# Patient Record
Sex: Female | Born: 1965
Health system: Southern US, Community
[De-identification: ages and names within clinical notes are randomized; demographics above are authoritative.]

## PROBLEM LIST (undated history)

## (undated) DIAGNOSIS — I10 Essential (primary) hypertension: Secondary | ICD-10-CM

## (undated) DIAGNOSIS — F32A Depression, unspecified: Secondary | ICD-10-CM

## (undated) DIAGNOSIS — E669 Obesity, unspecified: Secondary | ICD-10-CM

## (undated) DIAGNOSIS — F4310 Post-traumatic stress disorder, unspecified: Secondary | ICD-10-CM

## (undated) DIAGNOSIS — F329 Major depressive disorder, single episode, unspecified: Secondary | ICD-10-CM

## (undated) HISTORY — PX: INDUCED ABORTION: SHX677

---

## 1997-12-17 ENCOUNTER — Encounter: Admission: RE | Admit: 1997-12-17 | Discharge: 1997-12-17 | Payer: Self-pay | Admitting: Family Medicine

## 1998-01-13 ENCOUNTER — Encounter: Admission: RE | Admit: 1998-01-13 | Discharge: 1998-01-13 | Payer: Self-pay | Admitting: Family Medicine

## 1998-03-31 ENCOUNTER — Encounter: Admission: RE | Admit: 1998-03-31 | Discharge: 1998-03-31 | Payer: Self-pay | Admitting: Family Medicine

## 2002-04-29 ENCOUNTER — Inpatient Hospital Stay (HOSPITAL_COMMUNITY): Admission: EM | Admit: 2002-04-29 | Discharge: 2002-05-03 | Payer: Self-pay | Admitting: Psychiatry

## 2003-10-31 ENCOUNTER — Inpatient Hospital Stay (HOSPITAL_COMMUNITY): Admission: EM | Admit: 2003-10-31 | Discharge: 2003-11-02 | Payer: Self-pay | Admitting: *Deleted

## 2003-11-02 ENCOUNTER — Inpatient Hospital Stay (HOSPITAL_COMMUNITY): Admission: RE | Admit: 2003-11-02 | Discharge: 2003-11-06 | Payer: Self-pay | Admitting: Psychiatry

## 2004-03-04 ENCOUNTER — Ambulatory Visit: Payer: Self-pay | Admitting: *Deleted

## 2006-04-27 ENCOUNTER — Ambulatory Visit: Payer: Self-pay | Admitting: Family Medicine

## 2006-05-04 ENCOUNTER — Encounter (INDEPENDENT_AMBULATORY_CARE_PROVIDER_SITE_OTHER): Payer: Self-pay | Admitting: Family Medicine

## 2006-05-04 LAB — CONVERTED CEMR LAB
Cholesterol: 156 mg/dL
Hemoglobin: 12.9 g/dL
LDL Cholesterol: 87 mg/dL
RBC count: 4.5 10*6/uL
RBC: 4.5 M/uL
RDW: 14.3 %
Triglycerides: 75 mg/dL

## 2006-05-11 ENCOUNTER — Ambulatory Visit: Payer: Self-pay | Admitting: Family Medicine

## 2006-05-15 ENCOUNTER — Encounter: Payer: Self-pay | Admitting: Family Medicine

## 2006-05-15 DIAGNOSIS — F411 Generalized anxiety disorder: Secondary | ICD-10-CM | POA: Insufficient documentation

## 2006-05-15 DIAGNOSIS — F329 Major depressive disorder, single episode, unspecified: Secondary | ICD-10-CM

## 2006-05-15 DIAGNOSIS — A6000 Herpesviral infection of urogenital system, unspecified: Secondary | ICD-10-CM | POA: Insufficient documentation

## 2006-05-15 DIAGNOSIS — I10 Essential (primary) hypertension: Secondary | ICD-10-CM | POA: Insufficient documentation

## 2006-05-15 DIAGNOSIS — F32A Depression, unspecified: Secondary | ICD-10-CM | POA: Insufficient documentation

## 2006-05-15 DIAGNOSIS — Z8639 Personal history of other endocrine, nutritional and metabolic disease: Secondary | ICD-10-CM

## 2006-05-15 DIAGNOSIS — J309 Allergic rhinitis, unspecified: Secondary | ICD-10-CM | POA: Insufficient documentation

## 2006-05-15 DIAGNOSIS — H905 Unspecified sensorineural hearing loss: Secondary | ICD-10-CM | POA: Insufficient documentation

## 2006-05-15 DIAGNOSIS — Z862 Personal history of diseases of the blood and blood-forming organs and certain disorders involving the immune mechanism: Secondary | ICD-10-CM | POA: Insufficient documentation

## 2006-06-06 ENCOUNTER — Ambulatory Visit: Payer: Self-pay | Admitting: Family Medicine

## 2006-06-08 ENCOUNTER — Ambulatory Visit: Payer: Self-pay | Admitting: Family Medicine

## 2006-07-26 ENCOUNTER — Ambulatory Visit: Payer: Self-pay | Admitting: Family Medicine

## 2006-09-06 ENCOUNTER — Ambulatory Visit: Payer: Self-pay | Admitting: Family Medicine

## 2006-09-25 ENCOUNTER — Telehealth (INDEPENDENT_AMBULATORY_CARE_PROVIDER_SITE_OTHER): Payer: Self-pay | Admitting: Family Medicine

## 2006-10-18 ENCOUNTER — Ambulatory Visit: Payer: Self-pay | Admitting: Family Medicine

## 2006-10-18 DIAGNOSIS — M549 Dorsalgia, unspecified: Secondary | ICD-10-CM | POA: Insufficient documentation

## 2006-10-19 ENCOUNTER — Encounter (INDEPENDENT_AMBULATORY_CARE_PROVIDER_SITE_OTHER): Payer: Self-pay | Admitting: Family Medicine

## 2006-10-19 LAB — CONVERTED CEMR LAB
BUN: 11 mg/dL (ref 6–23)
Chloride: 109 meq/L (ref 96–112)
Potassium: 4.3 meq/L (ref 3.5–5.3)

## 2006-10-23 ENCOUNTER — Telehealth (INDEPENDENT_AMBULATORY_CARE_PROVIDER_SITE_OTHER): Payer: Self-pay | Admitting: Family Medicine

## 2006-10-25 ENCOUNTER — Telehealth (INDEPENDENT_AMBULATORY_CARE_PROVIDER_SITE_OTHER): Payer: Self-pay | Admitting: *Deleted

## 2006-10-26 ENCOUNTER — Ambulatory Visit: Payer: Self-pay | Admitting: Family Medicine

## 2006-11-15 ENCOUNTER — Encounter (INDEPENDENT_AMBULATORY_CARE_PROVIDER_SITE_OTHER): Payer: Self-pay | Admitting: Family Medicine

## 2006-11-15 ENCOUNTER — Encounter: Admission: RE | Admit: 2006-11-15 | Discharge: 2006-11-15 | Payer: Self-pay | Admitting: Family Medicine

## 2006-11-16 ENCOUNTER — Ambulatory Visit: Payer: Self-pay | Admitting: Family Medicine

## 2006-11-16 ENCOUNTER — Telehealth (INDEPENDENT_AMBULATORY_CARE_PROVIDER_SITE_OTHER): Payer: Self-pay | Admitting: *Deleted

## 2006-11-16 DIAGNOSIS — R0989 Other specified symptoms and signs involving the circulatory and respiratory systems: Secondary | ICD-10-CM | POA: Insufficient documentation

## 2006-11-16 DIAGNOSIS — R21 Rash and other nonspecific skin eruption: Secondary | ICD-10-CM | POA: Insufficient documentation

## 2006-11-16 DIAGNOSIS — N946 Dysmenorrhea, unspecified: Secondary | ICD-10-CM | POA: Insufficient documentation

## 2006-11-16 DIAGNOSIS — R0609 Other forms of dyspnea: Secondary | ICD-10-CM | POA: Insufficient documentation

## 2006-11-22 ENCOUNTER — Telehealth (INDEPENDENT_AMBULATORY_CARE_PROVIDER_SITE_OTHER): Payer: Self-pay | Admitting: Family Medicine

## 2006-11-27 ENCOUNTER — Emergency Department (HOSPITAL_COMMUNITY): Admission: EM | Admit: 2006-11-27 | Discharge: 2006-11-27 | Payer: Self-pay | Admitting: Emergency Medicine

## 2006-11-27 ENCOUNTER — Telehealth (INDEPENDENT_AMBULATORY_CARE_PROVIDER_SITE_OTHER): Payer: Self-pay | Admitting: Family Medicine

## 2006-12-12 ENCOUNTER — Telehealth (INDEPENDENT_AMBULATORY_CARE_PROVIDER_SITE_OTHER): Payer: Self-pay | Admitting: Family Medicine

## 2007-03-25 ENCOUNTER — Encounter (INDEPENDENT_AMBULATORY_CARE_PROVIDER_SITE_OTHER): Payer: Self-pay | Admitting: Family Medicine

## 2008-05-22 IMAGING — CR DG CHEST 2V
2 series · 2 of 2 positions shown · non-contrast
Comparison: 10/31/03.

CLINICAL DATA: 41 year-old-female with shortness of breath, bronchitis. 
CHEST - 2 VIEW:

[view not recorded (1 of 2)]
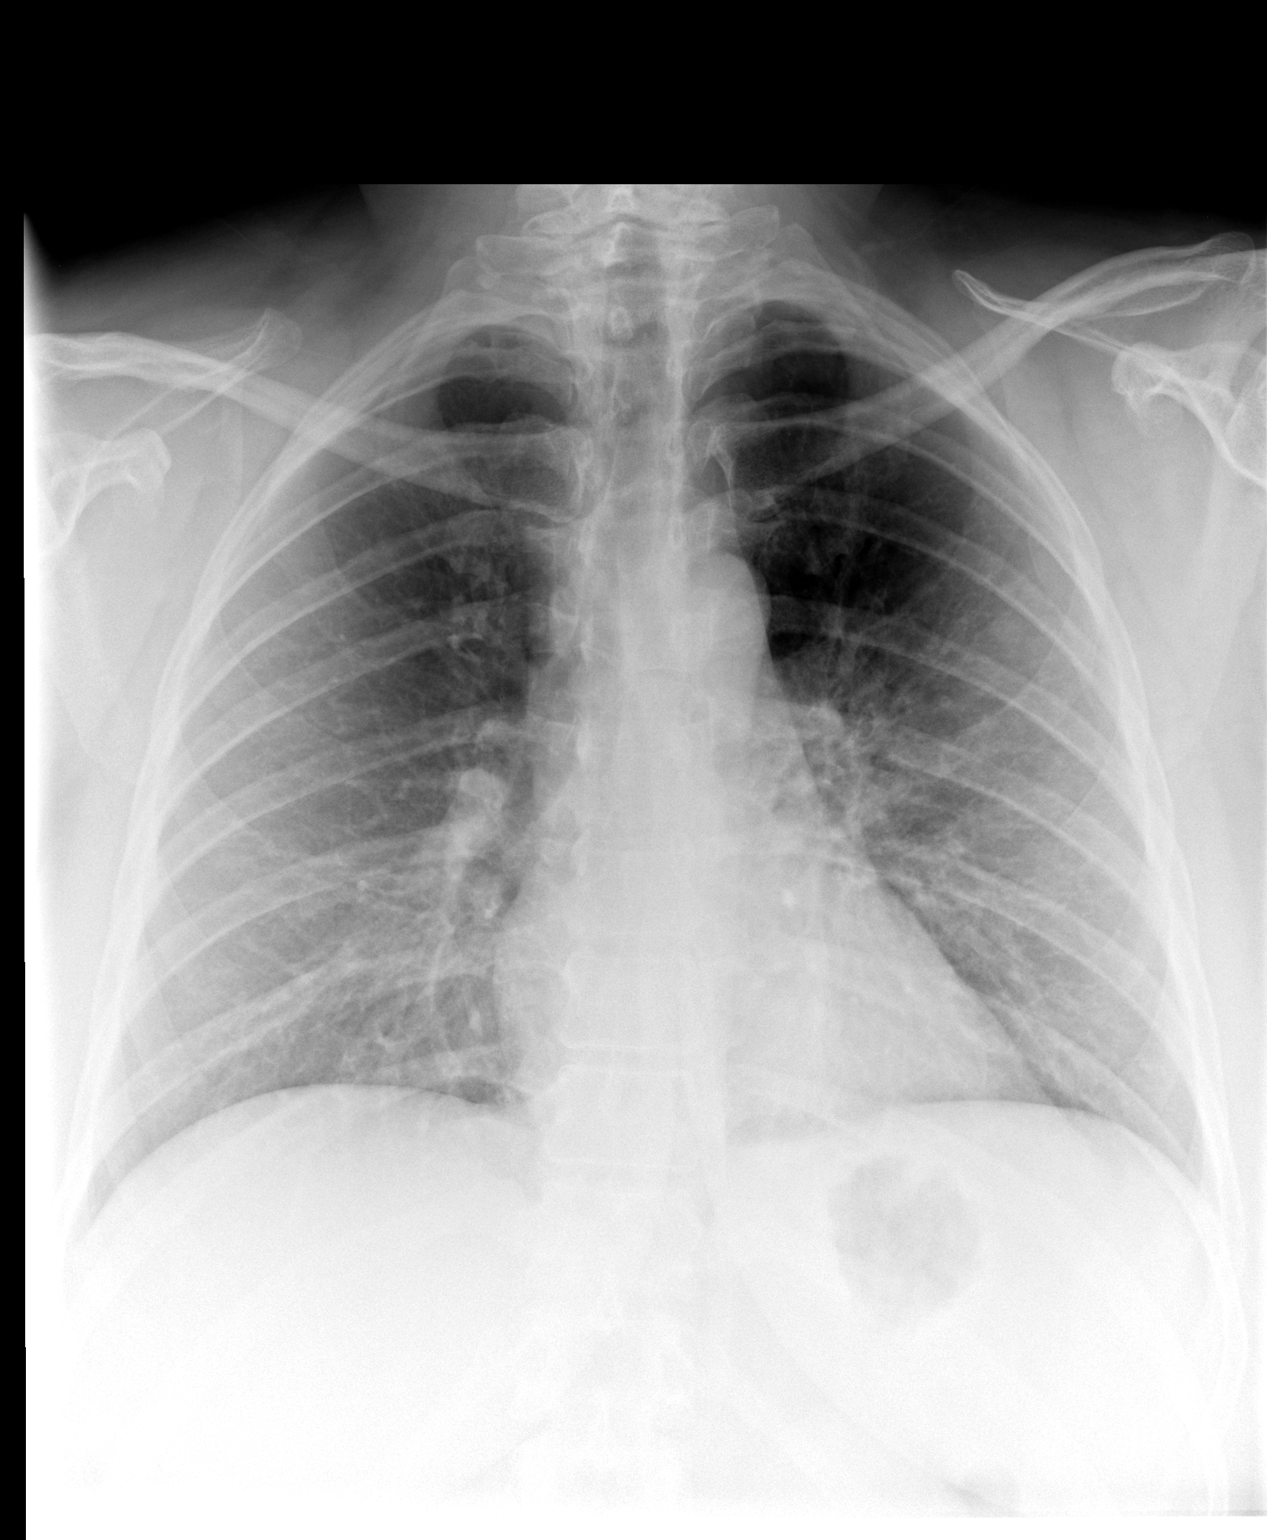

[view not recorded (2 of 2)]
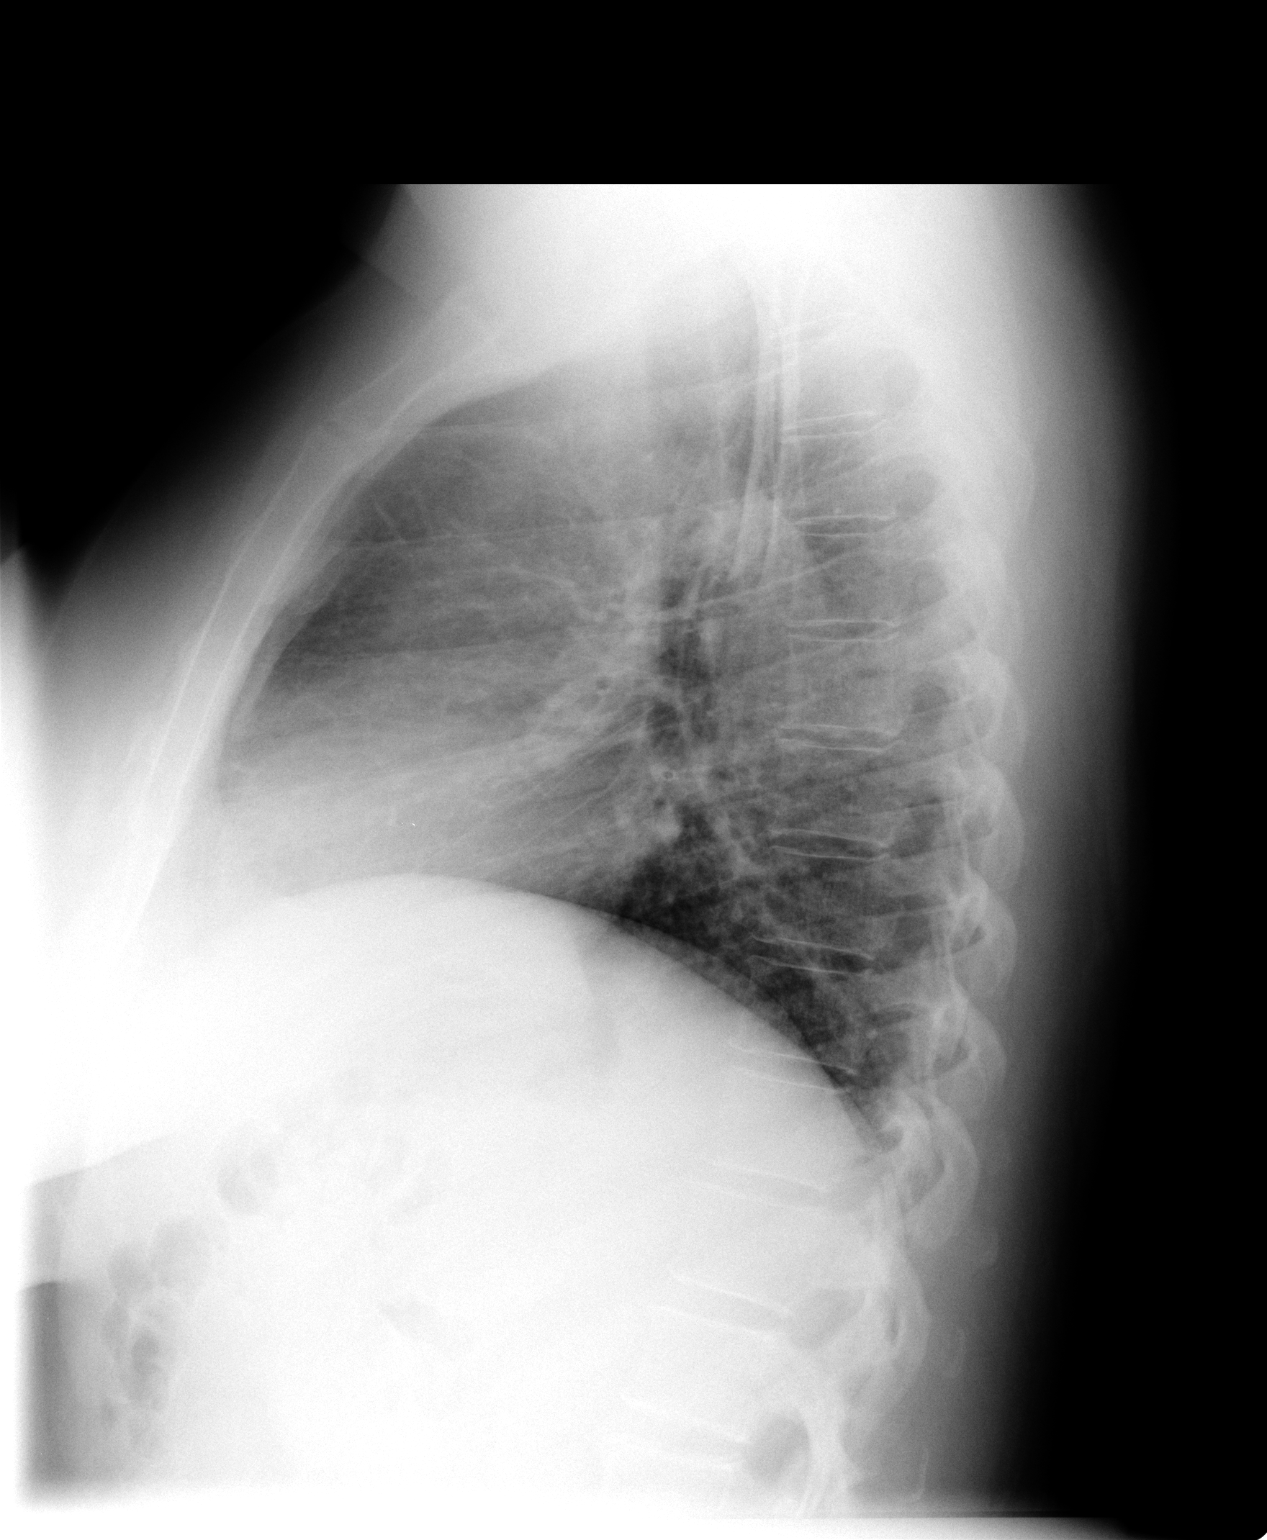

[2 of 2 positions shown; findings below may reference images not displayed]

FINDINGS: Mild central bronchial thickening without  acute pneumonia, edema, effusion or pneumothorax.   In the left upper chest, there is faint 10 mm nodular density.  This is a new finding compared to 10/31/03.  Underlying nodule is not excluded.
IMPRESSION: 1.  Chronic bronchial thickening suspicious for bronchitis. 
2.   Left upper lobe 10 mm nodule by plain radiography.    Recommend follow-up chest CT.

## 2008-12-17 ENCOUNTER — Ambulatory Visit (HOSPITAL_COMMUNITY): Admission: RE | Admit: 2008-12-17 | Discharge: 2008-12-17 | Payer: Self-pay | Admitting: Obstetrics & Gynecology

## 2008-12-17 ENCOUNTER — Other Ambulatory Visit: Admission: RE | Admit: 2008-12-17 | Discharge: 2008-12-17 | Payer: Self-pay | Admitting: Obstetrics and Gynecology

## 2010-07-17 ENCOUNTER — Encounter: Payer: Self-pay | Admitting: Family Medicine

## 2010-11-11 NOTE — Discharge Summary (Signed)
[name], [name]                        ACCOUNT NO.:  [id number]   MEDICAL RECORD NO.:  [id number]                   PATIENT TYPE:  IPS   LOCATION:  0303                                 [facility]ILITY[facility]  [facility]   PHYSICIAN:  Jeanice Lim, M.D.              DATE OF BIRTH:  01/28/1966   DATE OF ADMISSION:  04/29/2002  DATE OF DISCHARGE:  05/03/2002                                 DISCHARGE SUMMARY   IDENTIFYING DATA:  This is a 45 year old female, single, _____________  history of anxiety and borderline personality disorder referred by her  psychiatrist for hopelessness and thoughts of suicide.   MEDICATIONS:  1. Effexor  2. Folic acid  3. Zyprexa.  4. Topamax   ALLERGIES:  No known drug allergies.   PHYSICAL EXAMINATION:  Absolutely normal and it is neurologically nonfocal.   ADMISSION LABORATORIES:  Within normal limits.   MENTAL STATUS EXAM:  Obese, white female in no acute distress.  Fully alert,  cooperative, pleasant, calm.  Speech within normal limits.  Mood: Depressed  affect.  _____________________.  Thought process goal-directed, ___________  positive for suicidal ideation with planned overdose, contracting for  safety.  Cognitively intact.  Judgment and insight poor.   ADMISSION DIAGNOSES:   AXIS I:  Patient with personality disorder.   AXIS II:  Borderline personality disorder by history.   AXIS III:  None.   AXIS IV:  Moderate problems with work and financial stressors.   AXIS V:  36 plus 66.   HOSPITAL COURSE:  Patient was admitted with routine _______ medications,  underwent further monitoring and sponsored for safety.  Reported suicidal  ideation.  Planning different ways to kill herself and described multiple  depressive symptoms including cognitive dulling from Topamax.  Patient was  resumed on medications:  Effexor, folic acid, Zyprex, Topamax and Klonopin.  Topamax was decreased and Zyprexa optimized as well as Strattera added for  history of  ADD and to help with concentration and attention.  Patient  reported positive response to medication changes, participated in therapy  and after care planning and initially at discharge was improved.  Patient's  mood was more euthymic, affect brighter, thought process goal-directed,  thought content ___________ for dangerous ideation or psychotic symptoms.  Patient reported motivation to be compliant with the after care plan.   DISCHARGE MEDICATIONS:  Patient was discharged on medications:  1. Folic acid 1 mg q.d.  2. Klonopin 1 mg q.h.s.  3. Zyprexa 2.5 mg q.h.s.  4. Effexor XR 75 mg b.i.d.  5. Topamax 100 mg q.h.s.  6. Strattera 40 mg q.a.m.  7. Ambien 10 mg q.h.s. p.r.n.   Patient will follow up with Dr. Jennelle Human on November 17 at 3:00 PM and with me  November 10 at 10:00 AM.   DISCHARGE DIAGNOSES:   AXIS I:  Patient with personality disorder.   AXIS II:  Borderline personality disorder by history.   AXIS  III:  None.   AXIS IV:  Moderate problems with work and financial stressors.   AXIS VKallie Locks, M.D.    JEM/MEDQ  D:  05/19/2002  T:  05/19/2002  Job:  [id number]
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
## 2010-11-11 NOTE — Discharge Summary (Signed)
[name], [name]                      ACCOUNT NO.:  [id number]   MEDICAL RECORD NO.:  [id number]                   PATIENT TYPE:  IPS   LOCATION:  0506                                 [facility]ILITY[facility]  [facility]   PHYSICIAN:  Jeanice Lim, M.D.              DATE OF BIRTH:  [birth year]   DATE OF ADMISSION:  11/02/2003  DATE OF DISCHARGE:  11/06/2003                                 DISCHARGE SUMMARY   IDENTIFYING DATA:  This is a 45 year old single Caucasian female voluntarily  admitted with a history of borderline personality disorder and depression.  She had taken 100 tablets of various medications, pindolol, Geodon, Remeron,  after having an argument with mother.  She felt hopeless, took pills.  She  endorsed increased depression since December, loss of hope for future,  feeling worthless and increased irritability.  She was followed up by Meredith Staggers, M.D.  This was the second admission to [facility]; last in November 2003.  Felix Pacini was the therapist.  She attempted to cut wrist on January 5 and  saw Fred May for EMDR therapy.  She had used alcohol on the weekend, three  to four drinks on weekend days with reportedly none for 10 months, as per  the patient.  Primary care physician: The patient uses [facility].  Medical problems: Obesity.  She quit smoking and alcohol 10 months ago.   MEDICATIONS:  1. Pindolol 10 mg t.i.d.  2. Geodon 40 mg b.i.d.  3. Effexor XR 75 mg b.i.d.  4. Klonopin 0.5 mg q.h.s.  5. Zyprexa 5 mg q.h.s.  6. Topamax 100 mg q.h.s.  7. Folic acid.  8. B12.   The patient had been not taking Geodon, BuSpar, and pindolol, as well as  Seroquel due to lack of response.   DRUG ALLERGIES:  No known drug allergies.   PHYSICAL EXAMINATION:  GENERAL:  Within normal limits.  NEUROLOGIC:  Nonfocal.   LABORATORY DATA:  Routine admission labs:  Within normal limits.   MENTAL STATUS EXAM:  Fully alert, pleasant, polite, bright affect,  cooperative.  Speech:  Within normal limits, articulate.  Mood: Depressed,  hopeless.  Thought process: Goal directed; positive suicidal ideation,  ambivalent about outcome of attempt.  Cognitive: Intact.  Judgment and  insight: Fair to partial.  Impulse control: Questionable by history.   ADMISSION DIAGNOSES:   AXIS I:  Major depressive disorder, recurrent, severe.   AXIS II:  Borderline personality disorder, by history.   AXIS III:  1. Status post polypharmacy overdose.  2. Hearing impairment.  3. Obesity.   AXIS IV:  Moderate family conflict, problems with primary support group, and  economic problems.   AXIS V:  [id number]   HOSPITAL COURSE:  The patient was admitted, ordered routine p.r.n.  medications, underwent further monitoring, and was encouraged to participate  in individual, group, and milieu therapy.  Recommendation for the patient  for DBT, in which the  patient had not participated in the past but would be  appropriate for her condition.  The patient was monitored for safety.  The  patient participated in some healthy choices for lifestyle support regarding  weight control.  Routine admission labs and medical monitoring to rule out a  reversible organic etiology of psychopathology.  Initially, plan was to  increase Effexor to optimize control of depression.  The patient received  supportive therapy and medications were resumed as well as p.r.n.s ordered  at the time of admission.  Family session was ordered with mother due to  this being part of the trigger for overdose.  The patient was optimized on  Klonopin and this was then decreased since the patient felt that she was  doing quite well on her previous medications and did not want to make  changes.  The patient participated in further evaluation monitoring and  showed no inappropriate or dangerous behavior.  Dangerous ideation resolved.  The patient reported regret regarding overdose and showed increased insight  and judgment as well  improvement in healthy coping skills.   CONDITION ON DISCHARGE:  The patient was discharged in improved condition.  Mood was more euthymic.  Affect: Brighter.  Thought processes: Goal  directed.  Family meeting was productive in resolving family conflict, which  was a trigger.  The patient felt much more hopeful and in control,  regretting overdose again.  The patient was given medication education.   DISCHARGE MEDICATIONS:  1. Zyprexa 5 mg q.h.s.  2. Effexor XR 75 mg b.i.d.  3. Topamax 100 mg q.h.s.  4. Klonopin 0.5 mg q.h.s.   FOLLOW UP:  The patient requested papers be filled for FMLA out May 9 to  return May 15.  The patient was to follow up with Meredith Staggers, M.D., and  Lenore Cordia.   DISCHARGE DIAGNOSES:   AXIS I:  Major depressive disorder, recurrent, severe.   AXIS II:  Borderline personality disorder, by history.   AXIS III:  1. Status post polypharmacy overdose.  2. Hearing impairment.  3. Obesity.   AXIS IV:  Moderate family conflict, problems with primary support group, and  economic problems.   AXIS V:  Global assessment of functioning on discharge was 55.                                               Jeanice Lim, M.D.    JEM/MEDQ  D:  12/06/2003  T:  12/06/2003  Job:  [id number]

## 2011-04-13 LAB — DIFFERENTIAL
Basophils Absolute: 0
Basophils Relative: 1
Lymphs Abs: 1.8
Monocytes Absolute: 0.5
Monocytes Relative: 9
Neutro Abs: 3.5
Neutrophils Relative %: 59

## 2011-04-13 LAB — BASIC METABOLIC PANEL
BUN: 7
Calcium: 9
Chloride: 110
Creatinine, Ser: 0.69
GFR calc non Af Amer: >60
Glucose, Bld: 106 — ABNORMAL HIGH
Potassium: 3.8
Sodium: 140

## 2011-04-13 LAB — BLOOD GAS, ARTERIAL
Bicarbonate: 22.8
Patient temperature: 37
pH, Arterial: 7.527 — ABNORMAL HIGH
pO2, Arterial: 89.4

## 2011-07-05 ENCOUNTER — Emergency Department (HOSPITAL_COMMUNITY)
Admission: EM | Admit: 2011-07-05 | Discharge: 2011-07-05 | Disposition: A | Discharge status: Home / Self Care | Payer: BC Managed Care – PPO | Attending: Emergency Medicine | Admitting: Emergency Medicine

## 2011-07-05 ENCOUNTER — Emergency Department (HOSPITAL_COMMUNITY): Payer: BC Managed Care – PPO

## 2011-07-05 ENCOUNTER — Encounter (HOSPITAL_COMMUNITY): Payer: Self-pay | Admitting: Emergency Medicine

## 2011-07-05 DIAGNOSIS — R062 Wheezing: Secondary | ICD-10-CM | POA: Insufficient documentation

## 2011-07-05 DIAGNOSIS — R509 Fever, unspecified: Secondary | ICD-10-CM | POA: Insufficient documentation

## 2011-07-05 DIAGNOSIS — R0602 Shortness of breath: Secondary | ICD-10-CM | POA: Insufficient documentation

## 2011-07-05 DIAGNOSIS — R059 Cough, unspecified: Secondary | ICD-10-CM | POA: Insufficient documentation

## 2011-07-05 DIAGNOSIS — E119 Type 2 diabetes mellitus without complications: Secondary | ICD-10-CM | POA: Insufficient documentation

## 2011-07-05 DIAGNOSIS — I10 Essential (primary) hypertension: Secondary | ICD-10-CM | POA: Insufficient documentation

## 2011-07-05 DIAGNOSIS — J9801 Acute bronchospasm: Secondary | ICD-10-CM

## 2011-07-05 DIAGNOSIS — R05 Cough: Secondary | ICD-10-CM | POA: Insufficient documentation

## 2011-07-05 DIAGNOSIS — F329 Major depressive disorder, single episode, unspecified: Secondary | ICD-10-CM | POA: Insufficient documentation

## 2011-07-05 DIAGNOSIS — J189 Pneumonia, unspecified organism: Secondary | ICD-10-CM

## 2011-07-05 DIAGNOSIS — Z79899 Other long term (current) drug therapy: Secondary | ICD-10-CM | POA: Insufficient documentation

## 2011-07-05 DIAGNOSIS — F3289 Other specified depressive episodes: Secondary | ICD-10-CM | POA: Insufficient documentation

## 2011-07-05 HISTORY — DX: Depression, unspecified: F32.A

## 2011-07-05 HISTORY — DX: Obesity, unspecified: E66.9

## 2011-07-05 HISTORY — DX: Major depressive disorder, single episode, unspecified: F32.9

## 2011-07-05 HISTORY — DX: Essential (primary) hypertension: I10

## 2011-07-05 MED ORDER — IPRATROPIUM BROMIDE 0.02 % IN SOLN
0.5000 mg | Freq: Once | RESPIRATORY_TRACT | Status: AC
  Administered 2011-07-05: 0.5 mg via RESPIRATORY_TRACT
  Filled 2011-07-05: qty 2.5

## 2011-07-05 MED ORDER — ALBUTEROL SULFATE (5 MG/ML) 0.5% IN NEBU
5.0000 mg | INHALATION_SOLUTION | Freq: Once | RESPIRATORY_TRACT | Status: AC
  Administered 2011-07-05: 5 mg via RESPIRATORY_TRACT
  Filled 2011-07-05: qty 1

## 2011-07-05 MED ORDER — MOXIFLOXACIN HCL 400 MG PO TABS: 400.0000 mg | ORAL_TABLET | Freq: Every day | ORAL | Status: DC

## 2011-07-05 MED ORDER — ALBUTEROL SULFATE HFA 108 (90 BASE) MCG/ACT IN AERS
2.0000 | INHALATION_SPRAY | RESPIRATORY_TRACT | Status: DC
  Administered 2011-07-05: 2 via RESPIRATORY_TRACT
  Filled 2011-07-05: qty 6.7

## 2011-07-05 MED ORDER — MOXIFLOXACIN HCL 400 MG PO TABS
400.0000 mg | ORAL_TABLET | Freq: Once | ORAL | Status: AC
  Administered 2011-07-05: 400 mg via ORAL
  Filled 2011-07-05: qty 1

## 2011-07-05 MED ORDER — ONDANSETRON HCL 8 MG PO TABS
8.0000 mg | ORAL_TABLET | Freq: Once | ORAL | Status: AC
  Administered 2011-07-05: 8 mg via ORAL
  Filled 2011-07-05: qty 1

## 2011-07-05 MED ORDER — ONDANSETRON HCL 4 MG PO TABS: 8.0000 mg | ORAL_TABLET | Freq: Four times a day (QID) | ORAL | Status: AC

## 2011-07-05 NOTE — ED Notes (Signed)
PT. REPORTS PRODUCTIVE COUGH WITH SOB AND CHEST CONGESTION FOR 5 DAYS.

## 2011-07-05 NOTE — ED Provider Notes (Signed)
History     CSN: [id number]  Arrival date & time 07/05/11  0250   First MD Initiated Contact with Patient 07/05/11 0345      Chief Complaint  Patient presents with  . Cough    Patient is a 46 y.o. female presenting with cough. The history is provided by the patient.  Cough   patient reports 4-5 days of coughing congestion.  She reports some shortness of breath that developed today.  She's had subjective fever and chills at home.  She denies unilateral leg swelling.  She has no prior history of PE or DVT.  She reports no nausea vomiting or diarrhea.  She denies abdominal pain.  She reports no history of asthma or COPD.  She reports that she previously had stopped smoking for 6 years however one year ago she restarted smoking cigarettes.  Nothing worsens her symptoms.  Nothing improves her symptoms.  Her symptoms are constant.      Past Medical History  Diagnosis Date  . Diabetes mellitus   . Hypertension   . Depression   . Obesity     History reviewed. No pertinent past surgical history.  History reviewed. No pertinent family history.  History  Substance Use Topics  . Smoking status: Current Everyday Smoker  . Smokeless tobacco: Not on file  . Alcohol Use: No    OB History    Grav Para Term Preterm Abortions TAB SAB Ect Mult Living                  Review of Systems  Respiratory: Positive for cough.   All other systems reviewed and are negative.    Allergies  Review of patient's allergies indicates no known allergies.  Home Medications   Current Outpatient Rx  Name Route Sig Dispense Refill  . CETIRIZINE HCL 10 MG PO TABS Oral Take 10 mg by mouth daily.    . CORICIDIN HBP COUGH/COLD PO Oral Take 1 tablet by mouth 2 (two) times daily as needed. For cough    . CLONAZEPAM 0.5 MG PO TABS Oral Take 0.5 mg by mouth 2 (two) times daily.    [name] METFORMIN HCL 500 MG PO TABS Oral Take 500 mg by mouth 2 (two) times daily with a meal.    . PERPHENAZINE 4 MG PO TABS Oral  Take 10 mg by mouth at bedtime.    [name] VALSARTAN-HYDROCHLOROTHIAZIDE 320-12.5 MG PO TABS Oral Take 1 tablet by mouth daily.    . VENLAFAXINE HCL 75 MG PO TABS Oral Take 225 mg by mouth every morning.    [name] MOXIFLOXACIN HCL 400 MG PO TABS Oral Take 1 tablet (400 mg total) by mouth daily. 7 tablet 0  . ONDANSETRON HCL 4 MG PO TABS Oral Take 2 tablets (8 mg total) by mouth every 6 (six) hours. 12 tablet 0    BP 126/79  Pulse 113  Temp(Src) 98.9 F (37.2 C) (Oral)  Resp 22  SpO2 97%  LMP 06/28/2011  Physical Exam  Nursing note and vitals reviewed. Constitutional: She is oriented to person, place, and time. She appears well-developed and well-nourished. No distress.  HENT:  Head: Normocephalic and atraumatic.  Eyes: EOM are normal.  Neck: Normal range of motion.  Cardiovascular: Normal rate, regular rhythm and normal heart sounds.   Pulmonary/Chest: Effort normal. She has wheezes. She has no rales.       Rhonchi diffusely  Abdominal: Soft. She exhibits no distension. There is no tenderness.  Musculoskeletal: Normal range  of motion.  Neurological: She is alert and oriented to person, place, and time.  Skin: Skin is warm and dry.  Psychiatric: She has a normal mood and affect. Judgment normal.    ED Course  Procedures (including critical care time)  Labs Reviewed - No data to display Dg Chest 2 View  07/05/2011  *RADIOLOGY REPORT*  Clinical Data: Short of breath.  Productive cough.  CHEST - 2 VIEW  Comparison: 12/24/2006.  Findings: Faint left midlung patchy airspace opacity is present, suspicious for a small focus of pneumonia.  There is no effusion. Cardiopericardial silhouette appears within normal limits.  Right lung appears normal.  Trachea midline.  Radiographic follow-up is recommended to ensure clearing and exclude an underlying lesion.  Radiographic clearing is usually observed at 4-6 weeks.  IMPRESSION: Faint patchy left midlung airspace opacities suspicious for a small focus of  pneumonia.  Original Report Authenticated By: Andreas Newport, M.D.   I personally reviewed the x-ray  1. CAP (community acquired pneumonia)   2. Bronchospasm       MDM  The patient feels much better after albuterol and Atrovent in the emergency department.  She does appear to have community-acquired pneumonia and she's been given a dose of Avelox in the emergency department and will be discharged home with a seven-day course of Avelox.  I've instructed the patient to stop smoking.        Lyanne Co, MD 07/05/11 [phone]

## 2011-07-05 NOTE — ED Notes (Signed)
Patient complains of cough, and feeling sick since Saturday night, sts forceful cough, hx of bronchitis and feels the same. Chest and head congestion, sts even though her oxygen is ok she still feels trouble with breathing.

## 2011-08-02 NOTE — ED Notes (Signed)
Pt called stated she was not given prescribed Rx Avelox from visit 07/05/11 but was given something else Levofloxacin.   Record checked and call was rcvd from [facility] 07/05/11 (see Avelox Rx for 07/05/11) stating Rx was too expensive.  The chart was reviewed that day by the MD that saw the pt and New Rx for Levofloxacin 750 mg tab 1 po daily x 5 days was rcvd and called to [facility].  Pt now stating she needs the prescribed (Avelox) because the PNA is back and [facility] messed up.  Chart reviewed by Dr Effie Shy today and stated pt needs to be reevaluated.  Pt informed of MD's decision.

## 2012-04-18 ENCOUNTER — Other Ambulatory Visit: Payer: Self-pay | Admitting: Obstetrics & Gynecology

## 2012-04-18 DIAGNOSIS — Z1231 Encounter for screening mammogram for malignant neoplasm of breast: Secondary | ICD-10-CM

## 2012-05-16 ENCOUNTER — Ambulatory Visit: Payer: BC Managed Care – PPO

## 2012-12-24 ENCOUNTER — Emergency Department (INDEPENDENT_AMBULATORY_CARE_PROVIDER_SITE_OTHER)
Admission: EM | Admit: 2012-12-24 | Discharge: 2012-12-24 | Disposition: A | Discharge status: Home / Self Care | Payer: No Typology Code available for payment source | Source: Home / Self Care | Attending: Emergency Medicine | Admitting: Emergency Medicine

## 2012-12-24 ENCOUNTER — Encounter (HOSPITAL_COMMUNITY): Payer: Self-pay | Admitting: Emergency Medicine

## 2012-12-24 DIAGNOSIS — H6012 Cellulitis of left external ear: Secondary | ICD-10-CM

## 2012-12-24 DIAGNOSIS — H60399 Other infective otitis externa, unspecified ear: Secondary | ICD-10-CM

## 2012-12-24 NOTE — ED Notes (Signed)
Pt is needing a work note... Has not been to work in 7 days... Reports she was having anxiety/depression and she self medicated at home by resting... Also c/o left hear pain on x4 yrs; hearing aid on left ear visible... She is alert w/no signs of acute distress.

## 2012-12-24 NOTE — ED Provider Notes (Signed)
History    CSN: [id number] Arrival date & time 12/24/12  1037  First MD Initiated Contact with Patient 12/24/12 1128     Chief Complaint  Patient presents with  . Otalgia   (Consider location/radiation/quality/duration/timing/severity/associated sxs/prior Treatment) HPI Comments: Patient presents urgent care complaining of left ear pain that has been a recurrent percent many years. She tends to apply peroxide and recently was seen by a medical provider at her work site and was prescribed some type of ear drops. She has not fill out this prescription as of yet. Patient is very of said as she is reporting today that last week she was severely depressed for several days and for about 3-4 days she couldn't go to work. She stayed at home in bed at all times barely with any energy to the or talk." I was severely depressed" she reports her psychiatrist was out of town on vacation and she couldn't speak to anyone. Upon to return to work yesterday she was requested to bring a doctor's note reporting that it was okay for her to go back to work.  Although patient is still feeling somewhat anxious she describes she's feeling better wants to go back to work and does not want to lose her job.  Patient is a 47 y.o. female presenting with ear pain. The history is provided by the patient.  Otalgia Location:  Left Behind ear:  Swelling and redness Quality:  Dull Severity:  Mild Onset quality:  Gradual Timing:  Intermittent Chronicity:  Recurrent Context: not direct blow, not elevation change, not foreign body in ear, not loud noise and no water in ear   Relieved by:  Nothing Associated symptoms: rash   Associated symptoms: no abdominal pain, no congestion, no cough, no diarrhea, no ear discharge, no fever, no neck pain, no rhinorrhea, no sore throat, no tinnitus and no vomiting   Risk factors: no recent travel, no chronic ear infection and no prior ear surgery    Past Medical History  Diagnosis Date    . Diabetes mellitus   . Hypertension   . Depression   . Obesity    History reviewed. No pertinent past surgical history. History reviewed. No pertinent family history. History  Substance Use Topics  . Smoking status: Current Every Day Smoker  . Smokeless tobacco: Not on file  . Alcohol Use: No   OB History   Grav Para Term Preterm Abortions TAB SAB Ect Mult Living                 Review of Systems  Constitutional: Negative for fever and activity change.  HENT: Positive for ear pain. Negative for congestion, sore throat, facial swelling, rhinorrhea, neck pain, tinnitus and ear discharge.   Respiratory: Negative for cough.   Gastrointestinal: Negative for vomiting, abdominal pain and diarrhea.  Genitourinary: Negative for dysuria, enuresis, difficulty urinating and dyspareunia.  Skin: Positive for rash. Negative for color change and wound.    Allergies  Review of patient's allergies indicates no known allergies.  Home Medications   Current Outpatient Rx  Name  Route  Sig  Dispense  Refill  . clonazePAM (KLONOPIN) 0.5 MG tablet   Oral   Take 0.5 mg by mouth 2 (two) times daily.         . metFORMIN (GLUCOPHAGE) 500 MG tablet   Oral   Take 500 mg by mouth 2 (two) times daily with a meal.         . perphenazine (TRILAFON) 4  MG tablet   Oral   Take 10 mg by mouth at bedtime.         . valsartan-hydrochlorothiazide (DIOVAN-HCT) 320-12.5 MG per tablet   Oral   Take 1 tablet by mouth daily.         [name] venlafaxine (EFFEXOR) 75 MG tablet   Oral   Take 225 mg by mouth every morning.         . cetirizine (ZYRTEC) 10 MG tablet   Oral   Take 10 mg by mouth daily.         . Chlorpheniramine-DM (CORICIDIN HBP COUGH/COLD PO)   Oral   Take 1 tablet by mouth 2 (two) times daily as needed. For cough          BP 122/76  Pulse 84  Temp(Src) 99.1 F (37.3 C) (Oral)  Resp 18  SpO2 97% Physical Exam  Vitals reviewed. Constitutional: Vital signs are normal.  She appears well-developed and well-nourished.  Non-toxic appearance. She does not have a sickly appearance. She does not appear ill. No distress.  HENT:  Head: Normocephalic.  Right Ear: Tympanic membrane normal.  Left Ear: Tympanic membrane normal. No lacerations. There is drainage and tenderness. No foreign bodies. No mastoid tenderness. Tympanic membrane is not injected, not scarred, not perforated and not bulging. Tympanic membrane mobility is normal. No hemotympanum.  Ears:  Eyes: Conjunctivae are normal. Left eye exhibits no discharge.  Neck: Neck supple. No JVD present.  Abdominal: Soft.  Lymphadenopathy:    She has no cervical adenopathy.  Neurological: She is alert.  Skin: Rash noted. There is erythema. No pallor.    ED Course  Procedures (including critical care time) Labs Reviewed - No data to display No results found. 1. Cellulitis of external ear, left     MDM  Problem #1 chronic external ear infection//severity dermatitis- Instructed patient to stop the use of daily applications of peroxide- patient was seen by a provider at her workplace and was prescribed Corticosporin- she has not yet started.  Problem #2 depression/anxiety disorder Patient reports that during the course of last week she did not go to work for a couple days as she was feeling very depressed stayed at home, sleeping in bed. She did not contact her psychiatrist, and just stayed home Patient is requesting a note stating that it's okay for her to go back to work I instructed patient that there was no particular reason why she couldn't go back to work.  Jimmie Molly, MD 12/24/12 1308

## 2013-02-09 ENCOUNTER — Telehealth: Payer: Self-pay

## 2013-02-09 ENCOUNTER — Ambulatory Visit (INDEPENDENT_AMBULATORY_CARE_PROVIDER_SITE_OTHER): Payer: No Typology Code available for payment source | Admitting: Family Medicine

## 2013-02-09 VITALS — BP 108/78 | HR 96 | Temp 98.4°F | Resp 16 | Ht 69.0 in | Wt 233.0 lb

## 2013-02-09 DIAGNOSIS — H60399 Other infective otitis externa, unspecified ear: Secondary | ICD-10-CM

## 2013-02-09 DIAGNOSIS — H60392 Other infective otitis externa, left ear: Secondary | ICD-10-CM

## 2013-02-09 MED ORDER — CIPROFLOXACIN HCL 500 MG PO TABS: 500.0000 mg | ORAL_TABLET | Freq: Two times a day (BID) | ORAL | Status: DC

## 2013-02-09 MED ORDER — PREDNISONE 20 MG PO TABS: ORAL_TABLET | ORAL | Status: DC

## 2013-02-09 NOTE — Progress Notes (Signed)
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  She has been diagnosed with borderline personality and sees Dr. Donell Beers.  She has chronic otitis externa with chronic use of hearing aid.  She is having 1 day of progressive ear redness which burns and itches.  Objective:  Indurated left face and auricle with yellow green exudate in the canal.  Assessment:  Otitis externa with migration to left face  Plan:  cipro and prednisone.

## 2013-02-10 ENCOUNTER — Telehealth: Payer: Self-pay

## 2013-02-10 NOTE — Telephone Encounter (Signed)
Patient was here and seen and now she said that her symptoms are worse she would like a call back today if possible please call patient at [phone]

## 2013-02-11 ENCOUNTER — Other Ambulatory Visit: Payer: Self-pay | Admitting: Family Medicine

## 2013-02-11 ENCOUNTER — Telehealth: Payer: Self-pay

## 2013-02-11 DIAGNOSIS — R220 Localized swelling, mass and lump, head: Secondary | ICD-10-CM

## 2013-02-11 NOTE — Telephone Encounter (Signed)
Do you want me to call pt to tell her to come?

## 2013-02-11 NOTE — Telephone Encounter (Signed)
Spoke with [name], she still has welps on her face, itching, tenderness and it has migrated to her left eye. She feels she is getting worse and wanted to let Dr Milus Glazier know. What should she do now? She states she cannot come in because of financial reasons. Call at work if it is after today [phone] ext [phone].

## 2013-02-11 NOTE — Telephone Encounter (Signed)
PT WOULD LIKE SOMEONE TO CHECK WITH DR KURT TO SEE IF IT MAY BE SHINGLES ON HER FACE. PLEASE CALL [phone]

## 2013-02-11 NOTE — Telephone Encounter (Signed)
Patient is calling to check on her message.

## 2013-02-12 NOTE — Telephone Encounter (Signed)
Lm on pt cell to come into clinic to be evaluated.

## 2013-02-12 NOTE — Telephone Encounter (Signed)
Yes, please have her return

## 2013-05-20 NOTE — Telephone Encounter (Signed)
No notes

## 2013-06-21 ENCOUNTER — Emergency Department (HOSPITAL_COMMUNITY)
Admission: EM | Admit: 2013-06-21 | Discharge: 2013-06-21 | Disposition: A | Discharge status: Home Health | Payer: No Typology Code available for payment source | Attending: Emergency Medicine | Admitting: Emergency Medicine

## 2013-06-21 ENCOUNTER — Encounter (HOSPITAL_COMMUNITY): Payer: Self-pay | Admitting: Emergency Medicine

## 2013-06-21 DIAGNOSIS — R000 Tachycardia, unspecified: Secondary | ICD-10-CM | POA: Insufficient documentation

## 2013-06-21 DIAGNOSIS — F32A Depression, unspecified: Secondary | ICD-10-CM

## 2013-06-21 DIAGNOSIS — F411 Generalized anxiety disorder: Secondary | ICD-10-CM | POA: Insufficient documentation

## 2013-06-21 DIAGNOSIS — Z3202 Encounter for pregnancy test, result negative: Secondary | ICD-10-CM | POA: Insufficient documentation

## 2013-06-21 DIAGNOSIS — I10 Essential (primary) hypertension: Secondary | ICD-10-CM | POA: Insufficient documentation

## 2013-06-21 DIAGNOSIS — F3289 Other specified depressive episodes: Secondary | ICD-10-CM | POA: Insufficient documentation

## 2013-06-21 DIAGNOSIS — R443 Hallucinations, unspecified: Secondary | ICD-10-CM | POA: Insufficient documentation

## 2013-06-21 DIAGNOSIS — E119 Type 2 diabetes mellitus without complications: Secondary | ICD-10-CM | POA: Insufficient documentation

## 2013-06-21 DIAGNOSIS — F419 Anxiety disorder, unspecified: Secondary | ICD-10-CM

## 2013-06-21 DIAGNOSIS — F17200 Nicotine dependence, unspecified, uncomplicated: Secondary | ICD-10-CM | POA: Insufficient documentation

## 2013-06-21 DIAGNOSIS — F329 Major depressive disorder, single episode, unspecified: Secondary | ICD-10-CM | POA: Insufficient documentation

## 2013-06-21 DIAGNOSIS — Z79899 Other long term (current) drug therapy: Secondary | ICD-10-CM | POA: Insufficient documentation

## 2013-06-21 DIAGNOSIS — Z91040 Latex allergy status: Secondary | ICD-10-CM | POA: Insufficient documentation

## 2013-06-21 LAB — CBC
HCT: 40.6 % (ref 36.0–46.0)
Hemoglobin: 13.9 g/dL (ref 12.0–15.0)
MCH: 30.8 pg (ref 26.0–34.0)
MCHC: 34.2 g/dL (ref 30.0–36.0)
MCV: 89.8 fL (ref 78.0–100.0)
Platelets: 334 K/uL (ref 150–400)
RBC: 4.52 MIL/uL (ref 3.87–5.11)
RDW: 12.8 % (ref 11.5–15.5)
WBC: 10.6 K/uL — ABNORMAL HIGH (ref 4.0–10.5)

## 2013-06-21 LAB — COMPREHENSIVE METABOLIC PANEL WITH GFR
ALT: 17 U/L (ref 0–35)
AST: 17 U/L (ref 0–37)
Albumin: 4.2 g/dL (ref 3.5–5.2)
Alkaline Phosphatase: 66 U/L (ref 39–117)
BUN: 15 mg/dL (ref 6–23)
CO2: 22 meq/L (ref 19–32)
Calcium: 9.8 mg/dL (ref 8.4–10.5)
Chloride: 103 meq/L (ref 96–112)
Creatinine, Ser: 0.85 mg/dL (ref 0.50–1.10)
GFR calc Af Amer: >90 mL/min
GFR calc non Af Amer: 79 mL/min — ABNORMAL LOW
Glucose, Bld: 110 mg/dL — ABNORMAL HIGH (ref 70–99)
Potassium: 3.7 meq/L (ref 3.5–5.1)
Sodium: 139 meq/L (ref 135–145)
Total Bilirubin: 0.8 mg/dL (ref 0.3–1.2)
Total Protein: 7.3 g/dL (ref 6.0–8.3)

## 2013-06-21 LAB — RAPID URINE DRUG SCREEN, HOSP PERFORMED
Benzodiazepines: NOT DETECTED
Cocaine: NOT DETECTED
Opiates: NOT DETECTED

## 2013-06-21 LAB — ETHANOL: Alcohol, Ethyl (B): <11 mg/dL (ref 0–11)

## 2013-06-21 MED ORDER — VENLAFAXINE HCL 75 MG PO TABS: 200.0000 mg | ORAL_TABLET | Freq: Every day | ORAL | Status: DC

## 2013-06-21 MED ORDER — ONDANSETRON HCL 4 MG PO TABS: 4.0000 mg | ORAL_TABLET | Freq: Three times a day (TID) | ORAL | Status: DC | PRN

## 2013-06-21 MED ORDER — METFORMIN HCL 500 MG PO TABS
500.0000 mg | ORAL_TABLET | Freq: Two times a day (BID) | ORAL | Status: DC
  Filled 2013-06-21: qty 1

## 2013-06-21 MED ORDER — HYDROXYZINE HCL 10 MG PO TABS
10.0000 mg | ORAL_TABLET | Freq: Three times a day (TID) | ORAL | Status: DC
  Administered 2013-06-21: 10 mg via ORAL
  Filled 2013-06-21: qty 1

## 2013-06-21 MED ORDER — ACETAMINOPHEN 325 MG PO TABS: 650.0000 mg | ORAL_TABLET | ORAL | Status: DC | PRN

## 2013-06-21 MED ORDER — ZOLPIDEM TARTRATE 5 MG PO TABS: 5.0000 mg | ORAL_TABLET | Freq: Every evening | ORAL | Status: DC | PRN

## 2013-06-21 MED ORDER — IBUPROFEN 200 MG PO TABS: 600.0000 mg | ORAL_TABLET | Freq: Three times a day (TID) | ORAL | Status: DC | PRN

## 2013-06-21 MED ORDER — HYDROCHLOROTHIAZIDE 12.5 MG PO CAPS
12.5000 mg | ORAL_CAPSULE | Freq: Two times a day (BID) | ORAL | Status: DC
  Filled 2013-06-21 (×2): qty 1

## 2013-06-21 MED ORDER — LISINOPRIL 10 MG PO TABS
10.0000 mg | ORAL_TABLET | Freq: Every day | ORAL | Status: DC
  Filled 2013-06-21: qty 1
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
## 2013-06-21 NOTE — ED Provider Notes (Signed)
CSN: [id number]     Arrival date & time 06/21/13  1705 History   First MD Initiated Contact with Patient 06/21/13 1839     Chief Complaint  Patient presents with  . Medical Clearance   (Consider location/radiation/quality/duration/timing/severity/associated sxs/prior Treatment) HPI Comments: Patient here with a friend.  She states that she lost her job in October and had previously been on several psychiatric medications for about 17 years prior to that and had been stable in terms of mood.  She states that since loosing her job, she also lost her insurance and has no one to help with her medications.  She has been to [facility] several times and states that they refused to give her some of her medications (including klonopin) and so she went off these medications.  She states that she has been on effexor which has not really been helping with her depression and that she is tearful.  She is also concerned about some auditory hallucinations (she hears chain saws when there are none) and she states she is becoming more fearful at night.  She denies suicidal or homicidal ideation and states that she feels rather anxious.  She states that someone has told her that she seems "manic" and that she may be bipolar as well though she has only been diagnosed with depression in the past.  She states that she has not seen a psychiatrist since she went off her medications.  The history is provided by the patient. No language interpreter was used.    Past Medical History  Diagnosis Date  . Diabetes mellitus   . Hypertension   . Depression   . Obesity    No past surgical history on file. No family history on file. History  Substance Use Topics  . Smoking status: Current Every Day Smoker  . Smokeless tobacco: Not on file  . Alcohol Use: No   OB History   Grav Para Term Preterm Abortions TAB SAB Ect Mult Living                 Review of Systems  All other systems reviewed and are  negative.    Allergies  Latex  Home Medications   Current Outpatient Rx  Name  Route  Sig  Dispense  Refill  . HYDROXYZINE HCL PO   Oral   Take by mouth.         . metFORMIN (GLUCOPHAGE) 500 MG tablet   Oral   Take 500 mg by mouth 2 (two) times daily with a meal.         . venlafaxine (EFFEXOR) 100 MG tablet   Oral   Take 200 mg by mouth daily.          BP 118/81  Pulse 91  Temp(Src) 98.8 F (37.1 C) (Oral)  Resp 20  SpO2 97% Physical Exam  Nursing note and vitals reviewed. Constitutional: She is oriented to person, place, and time. She appears well-developed and well-nourished. No distress.  HENT:  Head: Normocephalic and atraumatic.  Right Ear: External ear normal.  Left Ear: External ear normal.  Nose: Nose normal.  Mouth/Throat: Oropharynx is clear and moist. No oropharyngeal exudate.  Eyes: Conjunctivae are normal. Pupils are equal, round, and reactive to light. No scleral icterus.  Neck: Normal range of motion. Neck supple.  Cardiovascular: Normal rate, regular rhythm and normal heart sounds.  Exam reveals no gallop and no friction rub.   No murmur heard. Tachycardia noted during triage resolved  Pulmonary/Chest: Effort  normal. No respiratory distress. She has no wheezes. She has no rales. She exhibits no tenderness.  Abdominal: Soft. Bowel sounds are normal. She exhibits no distension. There is no tenderness.  Musculoskeletal: Normal range of motion. She exhibits no edema and no tenderness.  Lymphadenopathy:    She has no cervical adenopathy.  Neurological: She is alert and oriented to person, place, and time. She exhibits normal muscle tone. Coordination normal.  Skin: Skin is warm and dry. No rash noted. No erythema. No pallor.  Psychiatric: Her behavior is normal. Her mood appears anxious. Her affect is labile. Her speech is rapid and/or pressured. Cognition and memory are normal. She expresses impulsivity. She expresses no homicidal and no suicidal  ideation.    ED Course  Procedures (including critical care time) Labs Review Labs Reviewed  CBC - Abnormal; Notable for the following:    WBC 10.6 (*)    All other components within normal limits  COMPREHENSIVE METABOLIC PANEL - Abnormal; Notable for the following:    Glucose, Bld 110 (*)    GFR calc non Af Amer 79 (*)    All other components within normal limits  ETHANOL  URINE RAPID DRUG SCREEN (HOSP PERFORMED)  POCT PREGNANCY, URINE   Imaging Review No results found.  EKG Interpretation   None      Results for orders placed during the hospital encounter of 06/21/13  CBC      Result Value Range   WBC 10.6 (*) 4.0 - 10.5 K/uL   RBC 4.52  3.87 - 5.11 MIL/uL   Hemoglobin 13.9  12.0 - 15.0 g/dL   HCT 16.1  09.6 - 04.5 %   MCV 89.8  78.0 - 100.0 fL   MCH 30.8  26.0 - 34.0 pg   MCHC 34.2  30.0 - 36.0 g/dL   RDW 40.9  81.1 - 91.4 %   Platelets 334  150 - 400 K/uL  COMPREHENSIVE METABOLIC PANEL      Result Value Range   Sodium 139  135 - 145 mEq/L   Potassium 3.7  3.5 - 5.1 mEq/L   Chloride 103  96 - 112 mEq/L   CO2 22  19 - 32 mEq/L   Glucose, Bld 110 (*) 70 - 99 mg/dL   BUN 15  6 - 23 mg/dL   Creatinine, Ser 7.82  0.50 - 1.10 mg/dL   Calcium 9.8  8.4 - 95.6 mg/dL   Total Protein 7.3  6.0 - 8.3 g/dL   Albumin 4.2  3.5 - 5.2 g/dL   AST 17  0 - 37 U/L   ALT 17  0 - 35 U/L   Alkaline Phosphatase 66  39 - 117 U/L   Total Bilirubin 0.8  0.3 - 1.2 mg/dL   GFR calc non Af Amer 79 (*) >90 mL/min   GFR calc Af Amer >90  >90 mL/min  ETHANOL      Result Value Range   Alcohol, Ethyl (B) <11  0 - 11 mg/dL  URINE RAPID DRUG SCREEN (HOSP PERFORMED)      Result Value Range   Opiates NONE DETECTED  NONE DETECTED   Cocaine NONE DETECTED  NONE DETECTED   Benzodiazepines NONE DETECTED  NONE DETECTED   Amphetamines NONE DETECTED  NONE DETECTED   Tetrahydrocannabinol NONE DETECTED  NONE DETECTED   Barbiturates NONE DETECTED  NONE DETECTED  POCT PREGNANCY, URINE      Result  Value Range   Preg Test, Ur NEGATIVE  NEGATIVE  No results found.   MDM  Depression Anxiety  Patient with long standing history of depression that had been stable on medication presents to the ED with worsening of her symptoms.  She is not suicidal or homicidal and is appropriate during interview and examination.  She now does not want to stay for any further evaluation, since she is clinically stable at this time and has a friend with her, I think it is reasonable for her to follow up on an outpatient basis.   Izola Price Marisue Humble, [place] 06/21/13 2156

## 2013-06-21 NOTE — ED Notes (Signed)
Pt states hx of suicide attempt.  States that she lost her job in October.  States that she has been in mental health tx since late 71s.  States that she has been manic.  C/o anxiety and depression but denies SI/HI.  States that she has not been on any of her meds since October.  Has been taking effexor but not anything else.

## 2013-06-21 NOTE — ED Notes (Signed)
Attempted to call report to secure area, RN will return call

## 2013-06-22 NOTE — ED Provider Notes (Signed)
Medical screening examination/treatment/procedure(s) were performed by non-physician practitioner and as supervising physician I was immediately available for consultation/collaboration.  EKG Interpretation   None         Shanna Cisco, MD 06/22/13 1220

## 2013-08-02 ENCOUNTER — Emergency Department (HOSPITAL_COMMUNITY)
Admission: EM | Admit: 2013-08-02 | Discharge: 2013-08-03 | Disposition: A | Discharge status: Home / Self Care | Payer: No Typology Code available for payment source | Attending: Emergency Medicine | Admitting: Emergency Medicine

## 2013-08-02 ENCOUNTER — Encounter (HOSPITAL_COMMUNITY): Payer: Self-pay | Admitting: Emergency Medicine

## 2013-08-02 DIAGNOSIS — F17200 Nicotine dependence, unspecified, uncomplicated: Secondary | ICD-10-CM | POA: Insufficient documentation

## 2013-08-02 DIAGNOSIS — Z91040 Latex allergy status: Secondary | ICD-10-CM | POA: Insufficient documentation

## 2013-08-02 DIAGNOSIS — E669 Obesity, unspecified: Secondary | ICD-10-CM | POA: Insufficient documentation

## 2013-08-02 DIAGNOSIS — G479 Sleep disorder, unspecified: Secondary | ICD-10-CM | POA: Insufficient documentation

## 2013-08-02 DIAGNOSIS — I10 Essential (primary) hypertension: Secondary | ICD-10-CM | POA: Insufficient documentation

## 2013-08-02 DIAGNOSIS — F411 Generalized anxiety disorder: Secondary | ICD-10-CM

## 2013-08-02 DIAGNOSIS — F3289 Other specified depressive episodes: Secondary | ICD-10-CM | POA: Insufficient documentation

## 2013-08-02 DIAGNOSIS — F39 Unspecified mood [affective] disorder: Secondary | ICD-10-CM

## 2013-08-02 DIAGNOSIS — F13939 Sedative, hypnotic or anxiolytic use, unspecified with withdrawal, unspecified: Secondary | ICD-10-CM

## 2013-08-02 DIAGNOSIS — F329 Major depressive disorder, single episode, unspecified: Secondary | ICD-10-CM

## 2013-08-02 DIAGNOSIS — F19939 Other psychoactive substance use, unspecified with withdrawal, unspecified: Secondary | ICD-10-CM | POA: Insufficient documentation

## 2013-08-02 DIAGNOSIS — F1310 Sedative, hypnotic or anxiolytic abuse, uncomplicated: Secondary | ICD-10-CM | POA: Insufficient documentation

## 2013-08-02 DIAGNOSIS — F419 Anxiety disorder, unspecified: Secondary | ICD-10-CM

## 2013-08-02 DIAGNOSIS — F13239 Sedative, hypnotic or anxiolytic dependence with withdrawal, unspecified: Secondary | ICD-10-CM

## 2013-08-02 DIAGNOSIS — Z3202 Encounter for pregnancy test, result negative: Secondary | ICD-10-CM | POA: Insufficient documentation

## 2013-08-02 DIAGNOSIS — Z79899 Other long term (current) drug therapy: Secondary | ICD-10-CM | POA: Insufficient documentation

## 2013-08-02 DIAGNOSIS — E119 Type 2 diabetes mellitus without complications: Secondary | ICD-10-CM | POA: Insufficient documentation

## 2013-08-02 DIAGNOSIS — F1910 Other psychoactive substance abuse, uncomplicated: Secondary | ICD-10-CM

## 2013-08-02 DIAGNOSIS — F19239 Other psychoactive substance dependence with withdrawal, unspecified: Secondary | ICD-10-CM | POA: Insufficient documentation

## 2013-08-02 HISTORY — DX: Post-traumatic stress disorder, unspecified: F43.10

## 2013-08-02 LAB — COMPREHENSIVE METABOLIC PANEL
ALT: 27 U/L (ref 0–35)
AST: 19 U/L (ref 0–37)
Albumin: 3.8 g/dL (ref 3.5–5.2)
Alkaline Phosphatase: 53 U/L (ref 39–117)
BUN: 9 mg/dL (ref 6–23)
CALCIUM: 8.8 mg/dL (ref 8.4–10.5)
CO2: 23 mEq/L (ref 19–32)
Chloride: 102 mEq/L (ref 96–112)
Creatinine, Ser: 0.68 mg/dL (ref 0.50–1.10)
GFR calc non Af Amer: >90 mL/min (ref >90)
GLUCOSE: 97 mg/dL (ref 70–99)
Potassium: 3.5 mEq/L — ABNORMAL LOW (ref 3.7–5.3)
SODIUM: 138 meq/L (ref 137–147)
Total Bilirubin: 0.8 mg/dL (ref 0.3–1.2)
Total Protein: 6.7 g/dL (ref 6.0–8.3)

## 2013-08-02 LAB — RAPID URINE DRUG SCREEN, HOSP PERFORMED
Amphetamines: NOT DETECTED
Barbiturates: NOT DETECTED
Benzodiazepines: NOT DETECTED
COCAINE: NOT DETECTED
OPIATES: NOT DETECTED
Tetrahydrocannabinol: NOT DETECTED

## 2013-08-02 LAB — CBC
HCT: 37.3 % (ref 36.0–46.0)
Hemoglobin: 12.7 g/dL (ref 12.0–15.0)
MCH: 30.5 pg (ref 26.0–34.0)
MCHC: 34 g/dL (ref 30.0–36.0)
MCV: 89.7 fL (ref 78.0–100.0)
PLATELETS: 277 10*3/uL (ref 150–400)
RBC: 4.16 MIL/uL (ref 3.87–5.11)
RDW: 13.2 % (ref 11.5–15.5)
WBC: 8 10*3/uL (ref 4.0–10.5)

## 2013-08-02 LAB — ETHANOL: Alcohol, Ethyl (B): <11 mg/dL (ref 0–11)

## 2013-08-02 LAB — SALICYLATE LEVEL: Salicylate Lvl: <2 mg/dL — ABNORMAL LOW (ref 2.8–20.0)

## 2013-08-02 LAB — POCT PREGNANCY, URINE: PREG TEST UR: NEGATIVE

## 2013-08-02 LAB — ACETAMINOPHEN LEVEL

## 2013-08-02 MED ORDER — LISINOPRIL-HYDROCHLOROTHIAZIDE 20-25 MG PO TABS: 1.0000 | ORAL_TABLET | Freq: Every day | ORAL | Status: DC

## 2013-08-02 MED ORDER — HYDROXYZINE HCL 25 MG PO TABS
25.0000 mg | ORAL_TABLET | Freq: Two times a day (BID) | ORAL | Status: DC
  Administered 2013-08-02 – 2013-08-03 (×2): 25 mg via ORAL
  Filled 2013-08-02 (×2): qty 1

## 2013-08-02 MED ORDER — ONDANSETRON HCL 4 MG PO TABS: 4.0000 mg | ORAL_TABLET | Freq: Three times a day (TID) | ORAL | Status: DC | PRN

## 2013-08-02 MED ORDER — LISINOPRIL 20 MG PO TABS
20.0000 mg | ORAL_TABLET | Freq: Every day | ORAL | Status: DC
  Administered 2013-08-02 – 2013-08-03 (×2): 20 mg via ORAL
  Filled 2013-08-02 (×2): qty 1

## 2013-08-02 MED ORDER — NICOTINE 21 MG/24HR TD PT24: 21.0000 mg | MEDICATED_PATCH | Freq: Every day | TRANSDERMAL | Status: DC

## 2013-08-02 MED ORDER — ACETAMINOPHEN 325 MG PO TABS: 650.0000 mg | ORAL_TABLET | ORAL | Status: DC | PRN

## 2013-08-02 MED ORDER — IBUPROFEN 200 MG PO TABS
600.0000 mg | ORAL_TABLET | Freq: Three times a day (TID) | ORAL | Status: DC | PRN
Start: 2013-08-02 — End: 2013-08-03
  Administered 2013-08-02: 600 mg via ORAL
  Filled 2013-08-02: qty 3

## 2013-08-02 MED ORDER — VENLAFAXINE HCL 50 MG PO TABS: 200.0000 mg | ORAL_TABLET | Freq: Every day | ORAL | Status: DC

## 2013-08-02 MED ORDER — HYDROXYZINE PAMOATE 25 MG PO CAPS
25.0000 mg | ORAL_CAPSULE | Freq: Two times a day (BID) | ORAL | Status: DC
Start: 2013-08-02 — End: 2013-08-02
  Filled 2013-08-02 (×2): qty 1

## 2013-08-02 MED ORDER — VITAMIN D3 25 MCG (1000 UNIT) PO TABS
1000.0000 [IU] | ORAL_TABLET | Freq: Every day | ORAL | Status: DC
  Administered 2013-08-02 – 2013-08-03 (×2): 1000 [IU] via ORAL
  Filled 2013-08-02 (×2): qty 1

## 2013-08-02 MED ORDER — HYDROCHLOROTHIAZIDE 25 MG PO TABS
25.0000 mg | ORAL_TABLET | Freq: Every day | ORAL | Status: DC
  Administered 2013-08-02 – 2013-08-03 (×2): 25 mg via ORAL
  Filled 2013-08-02 (×2): qty 1

## 2013-08-02 MED ORDER — VENLAFAXINE HCL ER 75 MG PO CP24
225.0000 mg | ORAL_CAPSULE | Freq: Every day | ORAL | Status: DC
  Administered 2013-08-02 – 2013-08-03 (×2): 225 mg via ORAL
  Filled 2013-08-02 (×3): qty 1

## 2013-08-02 MED ORDER — PERPHENAZINE 8 MG PO TABS
8.0000 mg | ORAL_TABLET | Freq: Every day | ORAL | Status: DC
  Administered 2013-08-02: 8 mg via ORAL
  Filled 2013-08-02 (×2): qty 1

## 2013-08-02 MED ORDER — HYDROXYZINE HCL 50 MG PO TABS
50.0000 mg | ORAL_TABLET | Freq: Every day | ORAL | Status: DC
  Administered 2013-08-02: 50 mg via ORAL
  Filled 2013-08-02 (×2): qty 1
  Filled 2013-08-02: qty 2

## 2013-08-02 MED ORDER — METFORMIN HCL 500 MG PO TABS
500.0000 mg | ORAL_TABLET | Freq: Two times a day (BID) | ORAL | Status: DC
  Administered 2013-08-02 – 2013-08-03 (×2): 500 mg via ORAL
  Filled 2013-08-02 (×5): qty 1

## 2013-08-02 NOTE — ED Notes (Signed)
Up to the bathroom 

## 2013-08-02 NOTE — ED Notes (Signed)
Josephine NP into see, pt concerned about receiving medication to help her sleep tonight

## 2013-08-02 NOTE — ED Notes (Addendum)
Pt eating breakfast at this time. Pt is calm and pleasant, denies pain or needs at this time. Will monitor

## 2013-08-02 NOTE — ED Notes (Signed)
Pt.'s personal belongings on 2 plastic bags contained; 1 brown Nanda Quinton with wallet ,no cash found.a pair of eyeglasses, cosmetic pouch ,dirver's license and box of assorted pills. 1 dasko red shoes , 1 purple sweater ,1 blue scarf and a pair of jeans, light blue shirt,kept in [employer] [address].

## 2013-08-02 NOTE — ED Notes (Signed)
Pt states she is in withdrawal from Klonopin, stopped taking d/t loss of insurance in October 2014. Pt c/o night sweats, and insomnia. Pt states she is unable to sit up in chair (currently lying on triage floor) or walk (pt ambulated to triage 1 with steady gait). Pt denies SI/HI. Rambling thoughts, pt reports multiple hospitalizations for mental health in last several years.

## 2013-08-02 NOTE — ED Provider Notes (Signed)
CSN: [id number]     Arrival date & time 08/02/13  0109 History   First MD Initiated Contact with Patient 08/02/13 0243     Chief Complaint  Patient presents with  . Medical Clearance   (Consider location/radiation/quality/duration/timing/severity/associated sxs/prior Treatment) HPI Pt is a 48yo female with hx of PTSD, depression, HTN, and DM presenting to ED tonight requesting help with withdrawal and sleep disturbance due to being off of Klonopin since October 2014. Pt states she was on the prescribed medication for 17 years but she had to stop "cold [place]" when she lost her health insurance. Pt is poor historian. Reports being seen by [facility] on Wednesday, 07/30/13 for "emergency detox" states they have not been able to help her.  Pt's counselor encouraged she come to ED for further evaluation and treatment for "Klonopin withdrawal syndrome."  States they are both concerned about her heart due to lack of sleep but denies chest pain or difficulty breathing. Reports sleeping 1-2 hours a night or going up to 2 days without sleeping.  Pt states she was seen by a PA and given hydroxyzine and advised it may take some time to build up in her system however pt states it did not seem to be working. Pt unable to give time frame for when medication was being taken.  Pt also reports on the verge of being evicted tomorrow and has been packing "nonstop" for the last 17 days.  States she has also been volunteering with homeless shelters because she is about to be homeless herself.  Denies SI/HI. States she does not know how she got to the ER tonight but states she has lost most of her family and friends.     Past Medical History  Diagnosis Date  . Diabetes mellitus   . Hypertension   . Depression   . Obesity   . PTSD (post-traumatic stress disorder)    History reviewed. No pertinent past surgical history. No family history on file. History  Substance Use Topics  . Smoking status: Current Every Day Smoker  .  Smokeless tobacco: Not on file  . Alcohol Use: Yes     Comment: occasional   OB History   Grav Para Term Preterm Abortions TAB SAB Ect Mult Living                 Review of Systems  Constitutional: Negative for fever and chills.  Cardiovascular: Negative for chest pain and palpitations.  Gastrointestinal: Negative for nausea, vomiting and abdominal pain.  Psychiatric/Behavioral: Positive for sleep disturbance. Negative for suicidal ideas, hallucinations, behavioral problems and self-injury. The patient is nervous/anxious.   All other systems reviewed and are negative.    Allergies  Latex  Home Medications   Current Outpatient Rx  Name  Route  Sig  Dispense  Refill  . cholecalciferol (VITAMIN D) 1000 UNITS tablet   Oral   Take 1,000 Units by mouth daily.         [name] etonogestrel (NEXPLANON) 68 MG IMPL implant   Subcutaneous   Inject 1 each into the skin once.         . fluticasone (FLONASE) 50 MCG/ACT nasal spray   Each Nare   Place 1 spray into both nostrils daily as needed for allergies or rhinitis.         . hydrOXYzine (VISTARIL) 25 MG capsule   Oral   Take 25-50 mg by mouth 3 (three) times daily. Take one capsule in the morning, and one at lunch and  2 at bedtime         . lisinopril-hydrochlorothiazide (PRINZIDE,ZESTORETIC) 20-25 MG per tablet   Oral   Take 1 tablet by mouth daily.         . metFORMIN (GLUCOPHAGE) 500 MG tablet   Oral   Take 500 mg by mouth 2 (two) times daily with a meal.         . venlafaxine (EFFEXOR) 100 MG tablet   Oral   Take 200 mg by mouth daily.          BP 147/76  Pulse 124  Temp(Src) 98.6 F (37 C) (Oral)  Resp 20  Wt 267 lb (121.11 kg)  SpO2 96% Physical Exam  Nursing note and vitals reviewed. Constitutional: She appears well-developed and well-nourished. No distress.  Pt lying in darkened room, sleeping but easily awakened.   HENT:  Head: Normocephalic and atraumatic.  Eyes: Conjunctivae are normal. No  scleral icterus.  Neck: Normal range of motion.  Cardiovascular: Normal rate, regular rhythm and normal heart sounds.   Pulmonary/Chest: Effort normal and breath sounds normal. No respiratory distress. She has no wheezes. She has no rales. She exhibits no tenderness.  Abdominal: Soft. Bowel sounds are normal. She exhibits no distension and no mass. There is no tenderness. There is no rebound and no guarding.  Musculoskeletal: Normal range of motion.  Neurological: She is alert.  Skin: Skin is warm and dry. She is not diaphoretic.  Psychiatric: Her behavior is normal. Thought content normal. Her affect is labile. Her speech is tangential. Thought content is not delusional. She expresses no homicidal and no suicidal ideation. She expresses no suicidal plans and no homicidal plans.    ED Course  Procedures (including critical care time) Labs Review Labs Reviewed  URINE RAPID DRUG SCREEN (HOSP PERFORMED)  ACETAMINOPHEN LEVEL  CBC  COMPREHENSIVE METABOLIC PANEL  ETHANOL  SALICYLATE LEVEL  POCT PREGNANCY, URINE   Imaging Review No results found.  EKG Interpretation   None       MDM   1. Substance abuse   2. Anxiety disorder   3. Withdrawal from benzodiazepine   Pt reports being off of Klonopin since October 2014. Reports severe insomnia due to withdrawal. On exam, pt has rambling thoughts.  It is unclear whether pt is requesting to be evaluated by [facility].  She is denying SI/HI at this time.  States she does not want to be given medication that is "dangerous" and does not want to be put on ativan as she states she knows that is dangerous too.  Will consult TTS to determine if pt will need official medical clearance with labs.   TTS has evaluated pt and determined she will benefit from being further evaluated at treated at [facility].  Psych hold orders placed, once labs are back and pt is fully medically cleared, she maybe be transferred to [facility] once bed is found.   Discussed pt with Dr. Sharol Given  who agrees with tx plan.  Noland Fordyce, PA-C 08/02/13 [phone]

## 2013-08-02 NOTE — ED Provider Notes (Signed)
Medical screening examination/treatment/procedure(s) were performed by non-physician practitioner and as supervising physician I was immediately available for consultation/collaboration.    Kalman Drape, MD 08/02/13 [phone]

## 2013-08-02 NOTE — Progress Notes (Signed)
Chaplain paged and was in unit within 5 minutes. For the purposes of disclosure the chaplain has a bi-lateral hearing loss and patient bonded with chaplain over this issue. Ms Ms Neace felt the chaplain understood parts of her story that others find puzzling or confusing.  Ms Wengert is a spiritual person who is not connected with any particular religious faith. 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 Her spirituality is a strong influence on how she thinks and how she reacts to life. Her religious background is found in the [place] traditions. Her mother's spirituality influences her.. Her mother is a Regulatory affairs officer. Her brother is a Marketing executive, but at the moment she and her brother on the spiritual outs.  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 Some of her affects may falsely lead care givers to believe she has greater physical and mental challenges than are present. She will focus on one's face to try to understand what is being said to her. She will repeat back questions to ensure she heard what is being said to her. She hesitates to think if she is on the correct path. Although she is bi-laterally deaf, she wears only one hearing aid. The other was lost to water damage and she can not afford to buy another. Her present single hearing aid needs maintenance, but is sufficient to hear if persons talk to her at a slightly higher volume than normal. Because of her hearing issues she carries a high level of spiritual, emotional and mental distress (pain).  Ms Kendra has gone to charitable agencies to see about assistance to replace her lost hearing aid. She was given a low end amplifier aid, which did little to help her. She may not have that device at present. 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 She gets frustrated at not being able to live up to her career expectations being unemployed and having no means to support herself at present. This has caused her to experience some crisis of faith (Why isn't God helping me more). On the other hand she has found a caring environment at [facility] and speaks highly of the [employer], Medical and Spiritual care she has been provided. Because of this she has decided to "re-boot" her life and priorities. She has a new sense of hope, and a new positive outlook on life.   Ms Harlin lost her long time job in the Fall. With the job loss was the further loss of medical benefits, causing her to be thrown into the public health system's "cattle call" medical care. Medications that she needs and are prescribed were out of reach due to financial distress.   Ms Zabawa asked that the chaplain remain in the room during her Mental Health assessment. This was approved by all parties. The major reason for this request was that she wished for the chaplain to be her advocate in dealing with her hearing loss issues and her retelling her story.  Prayer and stress counsel was given. Ms Lauman was encouraged not to see medical, mental and spiritual care as being mutually exclusive. The wholistic care she is being given is the standard at [facility] and her re-boot is a product of our approach to  care.  RECOMMEND as available Social Worker consults, Deaf services support, and chaplain support. Please page the chaplain at anytime if Ms Pedro feels she needs further spiritual care and support.  Sallee Lange. Kyl Givler D.Min. Chaplain

## 2013-08-02 NOTE — ED Notes (Addendum)
Chaplain reports after speaking with pt that she needs meds buts has no insurance, she is HOH and has only been given amplifiers which makes it hard for pt to hear well and is losing her apt and must move back in to take care of elderly parent. Chaplain suggests that pt speak back all information that is given to her to make sure she has complete understanding. Chaplain reports that pt is not suicidal and wants help with getting her life back on track.

## 2013-08-02 NOTE — Progress Notes (Signed)
Patient is pending a psych disposition in the morning.

## 2013-08-02 NOTE — ED Notes (Signed)
Up to the desk on the phone 

## 2013-08-02 NOTE — ED Notes (Signed)
Pt. stated that she don't want to take Ativan or clonopin anymore even if she's been  [name] /restless for the past few days.

## 2013-08-02 NOTE — Consult Note (Signed)
[facility] Consult   Reason for Consult:  Auditory hallucination, detox off Klonopin Referring Physician:  EDP [name] is an 48 y.o. female. Total Time spent with patient: 45 minutes  Assessment: AXIS I:  Mood d/o, Anxiety d/o, Major depressive d/o, recurrent AXIS II:  Deferred AXIS III:   Past Medical History  Diagnosis Date  . Diabetes mellitus   . Hypertension   . Depression   . Obesity   . PTSD (post-traumatic stress disorder)    AXIS IV:  occupational problems, other psychosocial or environmental problems, problems related to social environment and problems with primary support group AXIS V:  41-50 serious symptoms  Plan:  No evidence of imminent risk to self or others at present.    Subjective:   [name] is a 48 y.o. female patient evaluated for auditory hallucination secondary .to klonopin withdrawal  HPI:  [name] is a 88 year old white female who came in requesting detox from [facility]. Patient reports the following withdrawal symptoms "convulsions", cold sweats, irritability, anxiety, nausea, sleep and insomnia. Patient reports that [facility] refused to prescribe Clonazepan and that she now does not want to go back to taking it again..  Patient reports that she lost her insurance in October 2014 and therefore, she has to stop seeing her psychiatrist (Dr. Casimiro Needle) and begin seeing a Psychiatrist at [facility].  Patient reports a prior psychiatric hospitalization in 2005 for suicidal ideation. Patient reports being previously diagnosed with Anxiety Disorder, Major Depressive Disorder and Borderline Personality Disorder since the 1990s.  Patient reports auditory hallucinations since she stopped taking the Clonazepan in October 2014. Patient reports that she has been experiencing an auditory hallucination that consists of hearing chainsaw, radios playing, lawn movers, and wind noises. Patient reports frequent panic attacks but will not  like to go back to Klonopin.  Patient reports poor sleep and that she sleeps only 3 hours at night.  She also reports poor appetite and states she eats once or twice a day.  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  Patient is also requesting to restart her Trilofon which she taking in the past for her mood.  Patient will like to receive Vistaril for anxiety instead of Klonopin.  The interview lasted longer due to patient's bilateral deafness.  She denies SI/HI/VH.  We will keep patient overnight restart her Trilofon for her mood.  Patient will be reevaluated by Dr [name] in the morning.    HPI Elements:   Location:  Auditory hallucinations due to Klonopin withdrawal symptoms, excessive sweating and feeling depressed.. Quality:  Severe causing poor sleep and depressd mood. Severity:  discomfort, lack of concentration, lost job. Timing:  this has been going on since October when [facility] refused to reorder her klonopin and loss of job and insurance. Duration:  since october. 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  Past Psychiatric History: Past Medical History  Diagnosis Date  . Diabetes mellitus   . Hypertension   . Depression   . Obesity   . PTSD (post-traumatic stress disorder)     reports that she has been smoking.  She does not have any smokeless tobacco history on file. She reports that she drinks alcohol. She reports that she uses illicit drugs (Marijuana). No family history on file. Family History Substance Abuse: Yes, Describe: Family Supports: Yes, List: Living Arrangements: Alone Can pt return to current living arrangement?: Yes  Allergies:   Allergies  Allergen Reactions  . Latex Rash    ACT Assessment Complete:  Yes:    Educational Status    Risk to Self: Risk to self Suicidal Ideation: No Suicidal Intent: No Is patient at risk for suicide?: No, but patient  needs Medical Clearance Suicidal Plan?: No Access to Means: No What has been your use of drugs/alcohol within the last 12 months?: None Reported Previous Attempts/Gestures: Yes How many times?: 1 Other Self Harm Risks: None Reported Triggers for Past Attempts: Other (Comment) Intentional Self Injurious Behavior: None Family Suicide History: No Recent stressful life event(s): Other (Comment) Persecutory voices/beliefs?: Yes Depression: Yes Depression Symptoms: Tearfulness;Insomnia;Guilt;Feeling angry/irritable;Feeling worthless/self pity Substance abuse history and/or treatment for substance abuse?: Yes Suicide prevention information given to non-admitted patients: Yes  Risk to Others: Risk to Others Homicidal Ideation: No Thoughts of Harm to Others: No Current Homicidal Intent: No Current Homicidal Plan: No Access to Homicidal Means: No Identified Victim: NA History of harm to others?: No Assessment of Violence: None Noted Violent Behavior Description: None Does patient have access to weapons?: No Criminal Charges Pending?: No Does patient have a court date: No  Abuse:    Prior Inpatient Therapy: Prior Inpatient Therapy Prior Inpatient Therapy: Yes Prior Therapy Dates: 2005 and 2008 Prior Therapy Facilty/Provider([facility]): [facility] Reason for Treatment: SI and Psych   Prior Outpatient Therapy: Prior Outpatient Therapy Prior Outpatient Therapy: Yes Prior Therapy Dates: Ongoing Prior Therapy Facilty/Provider([facility]): [facility] Reason for Treatment: Medication Management and OPT  Additional Information: Additional Information 1:1 In Past 12 Months?: No CIRT Risk: No Elopement Risk: No Does patient have medical clearance?: Yes                  Objective: Blood pressure 135/73, pulse 72, temperature 98.1 F (36.7 C), temperature source Oral, resp. rate 16, weight 121.11 kg (267 lb), SpO2 98.00%.Body mass index is 39.41 kg/(m^2). Results  for orders placed during the hospital encounter of 08/02/13 (from the past 72 hour(s))  URINE RAPID DRUG SCREEN (HOSP PERFORMED)     Status: None   Collection Time    08/02/13  4:19 AM      Result Value Range   Opiates NONE DETECTED  NONE DETECTED   Cocaine NONE DETECTED  NONE DETECTED   Benzodiazepines NONE DETECTED  NONE DETECTED   Amphetamines NONE DETECTED  NONE DETECTED   Tetrahydrocannabinol NONE DETECTED  NONE DETECTED   Barbiturates NONE DETECTED  NONE DETECTED   Comment:            DRUG SCREEN FOR MEDICAL PURPOSES     ONLY.  IF CONFIRMATION IS NEEDED     FOR ANY PURPOSE, NOTIFY LAB     WITHIN 5 DAYS.                LOWEST DETECTABLE LIMITS     FOR URINE DRUG SCREEN     Drug Class       Cutoff (ng/mL)     Amphetamine      1000     Barbiturate      200     Benzodiazepine   841     Tricyclics       324     Opiates          300     Cocaine          300     THC  50  POCT PREGNANCY, URINE     Status: None   Collection Time    08/02/13  4:29 AM      Result Value Range   Preg Test, Ur NEGATIVE  NEGATIVE   Comment:            THE SENSITIVITY OF THIS     METHODOLOGY IS >24 mIU/mL  ACETAMINOPHEN LEVEL     Status: None   Collection Time    08/02/13  5:50 AM      Result Value Range   Acetaminophen (Tylenol), Serum <15.0  10 - 30 ug/mL   Comment:            THERAPEUTIC CONCENTRATIONS VARY     SIGNIFICANTLY. A RANGE OF 10-30     ug/mL MAY BE AN EFFECTIVE     CONCENTRATION FOR MANY PATIENTS.     HOWEVER, SOME ARE BEST TREATED     AT CONCENTRATIONS OUTSIDE THIS     RANGE.     ACETAMINOPHEN CONCENTRATIONS     >150 ug/mL AT 4 HOURS AFTER     INGESTION AND >50 ug/mL AT 12     HOURS AFTER INGESTION ARE     OFTEN ASSOCIATED WITH TOXIC     REACTIONS.  CBC     Status: None   Collection Time    08/02/13  5:50 AM      Result Value Range   WBC 8.0  4.0 - 10.5 K/uL   RBC 4.16  3.87 - 5.11 MIL/uL   Hemoglobin 12.7  12.0 - 15.0 g/dL   HCT 37.3  36.0 - 46.0 %   MCV  89.7  78.0 - 100.0 fL   MCH 30.5  26.0 - 34.0 pg   MCHC 34.0  30.0 - 36.0 g/dL   RDW 13.2  11.5 - 15.5 %   Platelets 277  150 - 400 K/uL  COMPREHENSIVE METABOLIC PANEL     Status: Abnormal   Collection Time    08/02/13  5:50 AM      Result Value Range   Sodium 138  137 - 147 mEq/L   Potassium 3.5 (*) 3.7 - 5.3 mEq/L   Chloride 102  96 - 112 mEq/L   CO2 23  19 - 32 mEq/L   Glucose, Bld 97  70 - 99 mg/dL   BUN 9  6 - 23 mg/dL   Creatinine, Ser 0.68  0.50 - 1.10 mg/dL   Calcium 8.8  8.4 - 10.5 mg/dL   Total Protein 6.7  6.0 - 8.3 g/dL   Albumin 3.8  3.5 - 5.2 g/dL   AST 19  0 - 37 U/L   ALT 27  0 - 35 U/L   Alkaline Phosphatase 53  39 - 117 U/L   Total Bilirubin 0.8  0.3 - 1.2 mg/dL   GFR calc non Af Amer >90  >90 mL/min   GFR calc Af Amer >90  >90 mL/min   Comment: (NOTE)     The eGFR has been calculated using the CKD EPI equation.     This calculation has not been validated in all clinical situations.     eGFR's persistently <90 mL/min signify possible Chronic Kidney     Disease.  ETHANOL     Status: None   Collection Time    08/02/13  5:50 AM      Result Value Range   Alcohol, Ethyl (B) <11  0 - 11 mg/dL   Comment:  LOWEST DETECTABLE LIMIT FOR     SERUM ALCOHOL IS 11 mg/dL     FOR MEDICAL PURPOSES ONLY  SALICYLATE LEVEL     Status: Abnormal   Collection Time    08/02/13  5:50 AM      Result Value Range   Salicylate Lvl <8.2 (*) 2.8 - 20.0 mg/dL   Labs are reviewed and are pertinent for Unremarkable..  Current Facility-Administered Medications  Medication Dose Route Frequency Provider Last Rate Last Dose  . acetaminophen (TYLENOL) tablet 650 mg  650 mg Oral Q4H PRN Linus Mako, PA-C      . cholecalciferol (VITAMIN D) tablet 1,000 Units  1,000 Units Oral Daily Linus Mako, PA-C   1,000 Units at 08/02/13 1323  . lisinopril (PRINIVIL,ZESTRIL) tablet 20 mg  20 mg Oral Daily Linus Mako, PA-C   20 mg at 08/02/13 1323   And  . hydrochlorothiazide  (HYDRODIURIL) tablet 25 mg  25 mg Oral Daily Linus Mako, PA-C   25 mg at 08/02/13 1323  . hydrOXYzine (ATARAX/VISTARIL) tablet 25 mg  25 mg Oral BID WC Linus Mako, PA-C   25 mg at 08/02/13 1324  . hydrOXYzine (ATARAX/VISTARIL) tablet 50 mg  50 mg Oral QHS Linus Mako, PA-C      . ibuprofen (ADVIL,MOTRIN) tablet 600 mg  600 mg Oral Q8H PRN Linus Mako, PA-C   600 mg at 08/02/13 1209  . metFORMIN (GLUCOPHAGE) tablet 500 mg  500 mg Oral BID WC Stephanie G Greene, PA-C      . nicotine (NICODERM CQ - dosed in mg/24 hours) patch 21 mg  21 mg Transdermal Daily Stephanie Marilu Favre, PA-C      . ondansetron (ZOFRAN) tablet 4 mg  4 mg Oral Q8H PRN Linus Mako, PA-C      . perphenazine (TRILAFON) tablet 8 mg  8 mg Oral QHS Delfin Gant, NP      . venlafaxine XR (EFFEXOR-XR) 24 hr capsule 225 mg  225 mg Oral Q breakfast Linus Mako, PA-C   225 mg at 08/02/13 1323   Current Outpatient Prescriptions  Medication Sig Dispense Refill  . cholecalciferol (VITAMIN D) 1000 UNITS tablet Take 1,000 Units by mouth daily.      [name] etonogestrel (NEXPLANON) 68 MG IMPL implant Inject 1 each into the skin once.      . fluticasone (FLONASE) 50 MCG/ACT nasal spray Place 1 spray into both nostrils daily as needed for allergies or rhinitis.      . hydrOXYzine (VISTARIL) 25 MG capsule Take 25-50 mg by mouth 3 (three) times daily. Take one capsule in the morning, and one at lunch and 2 at bedtime      . lisinopril-hydrochlorothiazide (PRINZIDE,ZESTORETIC) 20-25 MG per tablet Take 1 tablet by mouth daily.      . metFORMIN (GLUCOPHAGE) 500 MG tablet Take 500 mg by mouth 2 (two) times daily with a meal.      . venlafaxine XR (EFFEXOR-XR) 75 MG 24 hr capsule Take 225 mg by mouth daily with breakfast.       Review of Physical examination done by the EDP 08/02/13 AT 03 am is WNL. Psychiatric Specialty Exam:     Blood pressure 135/73, pulse 72, temperature 98.1 F (36.7 C), temperature source Oral, resp.  rate 16, weight 121.11 kg (267 lb), SpO2 98.00%.Body mass index is 39.41 kg/(m^2).  General Appearance: Casual  Eye Contact::  Good  Speech:  Clear and Coherent and Normal Rate  Volume:  Normal  Mood:  Anxious, Depressed, Hopeless and Irritable  Affect:  Congruent, Depressed and Tearful  Thought Process:  Coherent, Goal Directed and Intact  Orientation:  Full (Time, Place, and Person)  Thought Content:  Hallucinations: Auditory  Suicidal Thoughts:  No  Homicidal Thoughts:  No  Memory:  Immediate;   Good Recent;   Good Remote;   Good  Judgement:  Fair  Insight:  Present  Psychomotor Activity:  Normal  Concentration:  Good  Recall:  NA  Fund of Knowledge:GOOD, STARTS AND COMPLETES SENTENCES  Language: English is her primary language  Akathisia:  NA  Handed:  Right  AIMS (if indicated):     Assets:  Desire for Improvement  Sleep:      Musculoskeletal: Strength & Muscle Tone: within normal limits Gait & Station: normal Patient leans: N/A  Treatment Plan Summary:  Consult and face to face interview with Dr [name] We will keep patient overnight for safety and observation We will restart her Trilofon 8 mg po qhs for her mood  Daily contact with patient to assess and evaluate symptoms and progress in treatment Medication management Will be spending the night in [facility] for evaluation in am.  Charmaine Downs, C   PMHNP-BC 08/02/2013 5:03 PM  I have personally seen the patient and agreed with the findings and involved in the treatment plan. Berniece Andreas, MD

## 2013-08-02 NOTE — BH Assessment (Addendum)
Assessment Note   [name] is a 48 year old white female who reports detox from [facility].  Patient reports the following withdrawal symptoms.  Patient reports having "convulsions", cold sweats, irritability, anxiety, nausea, sleep and insomnia.  Patient reports that [facility] refused to prescribe Clonazepan.  Patient reports that she last her insurance in October 2014.  Therefore, she has to stop seeing her psychiatrist (Dr. Casimiro Needle) and begin seeing a Psychiatrist at [facility].   Patient reports a prior psychiatric hospitalization in 2005 for suicidal ideation.   Patient reports being previously diagnosed with Anxiety Disorder, Major Depressive Disorder and Borderline Personality Disorder since the 1990s.  Patient reports auditory hallucinations since she stopped taking the Clonazepan in October 2014.  Patient reports that she has been experiencing an auditory hallucination that consists of hearing chainsaw, radios playing, lawn movers, and wind noises. Patient reports frequent panic attacks.     Axis I: Anxiety Disorder, Mood Disorder, Major Depressive Disorder  Axis II: Deferred Axis III:  Past Medical History  Diagnosis Date  . Diabetes mellitus   . Hypertension   . Depression   . Obesity   . PTSD (post-traumatic stress disorder)    Axis IV: economic problems, housing problems, occupational problems, other psychosocial or environmental problems, problems related to social environment, problems with access to health care services and problems with primary support group Axis V: 31-40 impairment in reality testing  Past Medical History:  Past Medical History  Diagnosis Date  . Diabetes mellitus   . Hypertension   . Depression   . Obesity   . PTSD (post-traumatic stress disorder)     History reviewed. No pertinent past surgical history.  Family History: No family history on file.  Social History:  reports that she has been smoking.  She does not have any smokeless  tobacco history on file. She reports that she drinks alcohol. She reports that she uses illicit drugs (Marijuana).  Additional Social History:     CIWA: CIWA-Ar BP: 147/76 mmHg Pulse Rate: 124 COWS:    Allergies:  Allergies  Allergen Reactions  . Latex Rash    Home Medications:  (Not in a hospital admission)  OB/GYN Status:  No LMP recorded. Patient has had an implant.  General Assessment Data Location of Assessment: [facility] ACT Assessment: Yes Is this a Tele or Face-to-Face Assessment?: Face-to-Face Is this an Initial Assessment or a Re-assessment for this encounter?: Initial Assessment Living Arrangements: Alone Can pt return to current living arrangement?: Yes Admission Status: Voluntary Is patient capable of signing voluntary admission?: Yes Transfer from: [facility] Referral Source: Self/Family/Friend     [place] Living Arrangements: Alone Name of Psychiatrist: Beverly Sessions Name of Therapist: Lona Kettle  Education Status Is patient currently in school?: No Current Grade: NA Highest grade of school patient has completed: NA Name of school: NA Contact person: NA  Risk to self Suicidal Ideation: No Suicidal Intent: No Is patient at risk for suicide?: No, but patient needs Medical Clearance Suicidal Plan?: No Access to Means: No What has been your use of drugs/alcohol within the last 12 months?: None Reported Previous Attempts/Gestures: Yes How many times?: 1 Other Self Harm Risks: None Reported Triggers for Past Attempts: Other (Comment) Intentional Self Injurious Behavior: None Family Suicide History: No Recent stressful life event(s): Other (Comment) Persecutory voices/beliefs?: Yes Depression: Yes Depression Symptoms: Tearfulness;Insomnia;Guilt;Feeling angry/irritable;Feeling worthless/self pity Substance abuse history and/or treatment for substance abuse?: No Suicide prevention information given to non-admitted patients: Yes  Risk  to  Others Homicidal Ideation: No Thoughts of Harm to Others: No Current Homicidal Intent: No Current Homicidal Plan: No Access to Homicidal Means: No Identified Victim: NA History of harm to others?: No Assessment of Violence: None Noted Violent Behavior Description: None Does patient have access to weapons?: No Criminal Charges Pending?: No Does patient have a court date: No  Psychosis Hallucinations: Auditory (hearing chainsaw, radio and wind noises) Delusions: None noted  Mental Status Report Appear/Hygiene: Other (Comment) Eye Contact: Fair Motor Activity: Freedom of movement Speech: Logical/coherent Level of Consciousness: Alert Mood: Depressed;Anxious Affect: Anxious Anxiety Level: Moderate Thought Processes: Coherent;Relevant Judgement: Unimpaired Orientation: Person;Place;Time;Situation Obsessive Compulsive Thoughts/Behaviors: None  Cognitive Functioning Concentration: Decreased Memory: Recent Intact;Remote Intact IQ: Average Insight: Fair Impulse Control: Poor Appetite: Fair Weight Loss: 0 Weight Gain: 0 Sleep: Decreased Total Hours of Sleep: 2 Vegetative Symptoms: Decreased grooming;Not bathing;Staying in bed  ADLScreening [facility] Assessment Services) Patient's cognitive ability adequate to safely complete daily activities?: Yes Patient able to express need for assistance with ADLs?: Yes Independently performs ADLs?: Yes (appropriate for developmental age)  Prior Inpatient Therapy Prior Inpatient Therapy: Yes Prior Therapy Dates: 2005 and 2008 Prior Therapy Facilty/Provider(s): [facility] Reason for Treatment: SI and Psych   Prior Outpatient Therapy Prior Outpatient Therapy: Yes Prior Therapy Dates: Ongoing Prior Therapy Facilty/Provider(s): [facility]rapist Reason for Treatment: Medication Management and OPT  ADL Screening (condition at time of admission) Patient's cognitive ability adequate to safely complete daily activities?:  Yes Patient able to express need for assistance with ADLs?: Yes Independently performs ADLs?: Yes (appropriate for developmental age)         Values / Beliefs Cultural Requests During Hospitalization: None Spiritual Requests During Hospitalization: None        Additional Information 1:1 In Past 12 Months?: No CIRT Risk: No Elopement Risk: No Does patient have medical clearance?: Yes     Disposition: Pending psych disposition in the morning.  Disposition Initial Assessment Completed for this Encounter: Yes Disposition of Patient: Other dispositions Other disposition(s): Other (Comment)  On Site Evaluation by:   Reviewed with Physician:    Graciella Freer [place] 08/02/2013 5:27 AM

## 2013-08-02 NOTE — ED Notes (Signed)
Pt. And belonging have been wanded.

## 2013-08-02 NOTE — ED Notes (Signed)
Waiting for pt to be transferred from [facility]

## 2013-08-02 NOTE — ED Notes (Signed)
TTS MD and NP at pt bedside

## 2013-08-02 NOTE — ED Notes (Signed)
Pt requested to speak to chaplain. Chaplain paged and is speaking with pt at this time

## 2013-08-03 ENCOUNTER — Encounter (HOSPITAL_COMMUNITY): Payer: Self-pay | Admitting: Registered Nurse

## 2013-08-03 DIAGNOSIS — F39 Unspecified mood [affective] disorder: Secondary | ICD-10-CM | POA: Diagnosis present

## 2013-08-03 DIAGNOSIS — F339 Major depressive disorder, recurrent, unspecified: Secondary | ICD-10-CM

## 2013-08-03 DIAGNOSIS — F411 Generalized anxiety disorder: Secondary | ICD-10-CM | POA: Diagnosis present

## 2013-08-03 DIAGNOSIS — F329 Major depressive disorder, single episode, unspecified: Secondary | ICD-10-CM | POA: Diagnosis present

## 2013-08-03 MED ORDER — HYDROXYZINE PAMOATE 25 MG PO CAPS: 25.0000 mg | ORAL_CAPSULE | Freq: Three times a day (TID) | ORAL | Status: DC | PRN

## 2013-08-03 MED ORDER — PERPHENAZINE 8 MG PO TABS: 8.0000 mg | ORAL_TABLET | Freq: Every day | ORAL | Status: DC

## 2013-08-03 MED ORDER — HYDROXYZINE HCL 25 MG PO TABS: 25.0000 mg | ORAL_TABLET | Freq: Two times a day (BID) | ORAL | Status: DC

## 2013-08-03 NOTE — ED Notes (Signed)
CSW in w/ pt

## 2013-08-03 NOTE — ED Notes (Signed)
CSW into see 

## 2013-08-03 NOTE — ED Notes (Signed)
written dc instructions reviewed w/ pt, pt verbalized understanding.  Pt encouraged to follow up w/ her dr as instructed and returned/seek treatment for any suicidal/homicidal thoughts or urges pt verbalized understanding.  Pt ambulatory to dc window w/ mHt , belongings returned after leaving the unit.

## 2013-08-03 NOTE — Progress Notes (Signed)
Psych MD and NP consult CSW to speak with pt about outpatient resources.  CSW met with pt for about 30 minutes. CSW provided supportive counseling as pt described problems she has had since job loss, and frustrations she has had using public mental health services. CSW and pt discussed utilizing [facility], at least for the short term, so she can get appropriate medicine. CSW provided overview of how pt could get Medicaid.   Pt thanked CSW for her time.  Rochele Pages,     ED CSW  phone: [phone] 11:31am

## 2013-08-03 NOTE — Consult Note (Signed)
[facility] Follow Up Psychiatry Consult   Reason for Consult:  Auditory hallucination, detox off Klonopin Referring Physician:  EDP PAITYN Mcbride is an 48 y.o. female. Total Time spent with patient: 45 minutes  Assessment: AXIS I:  Anxiety Disorder NOS, Mood Disorder NOS and Major Depressive Disorder, recurrent AXIS II:  Deferred AXIS III:   Past Medical History  Diagnosis Date  . Diabetes mellitus   . Hypertension   . Depression   . Obesity   . PTSD (post-traumatic stress disorder)    AXIS IV:  occupational problems, other psychosocial or environmental problems, problems related to social environment and problems with primary support group AXIS V:  61-70 mild symptoms  Plan:  No evidence of imminent risk to self or others at present.    Subjective:   [name] is a 48 y.o. female patient   HPI:  Patient states that she is feeling much better today.  Patient states that she slept well and is ready for discharge.  "Yesterday when I came in my anxiety was a 10/10; today it is like 2-3/10.  I feel much better.  No crying or shakes today. I feel like I would like to go home."  Patient states [facility] and that she sees a Dr. Pamala Hurry.  Patient denies suicidal/homicidal ideation, psychosis, and paranoia.   HPI Elements:   Location:  Auditory hallucinations due to Klonopin withdrawal symptoms, excessive sweating and feeling depressed.. Quality:  Severe causing poor sleep and depressd mood. Severity:  discomfort, lack of concentration, lost job. Timing:  this has been going on since October when [facility] refused to reorder her klonopin and loss of job and insurance. Duration:  since october. 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  Past Psychiatric History: Past Medical History  Diagnosis Date  . Diabetes mellitus   . Hypertension   . Depression   . Obesity   .  PTSD (post-traumatic stress disorder)     reports that she has been smoking.  She does not have any smokeless tobacco history on file. She reports that she drinks alcohol. She reports that she uses illicit drugs (Marijuana). No family history on file. Family History Substance Abuse: Yes, Describe: Family Supports: Yes, List: Living Arrangements: Alone Can pt return to current living arrangement?: Yes   Allergies:   Allergies  Allergen Reactions  . Latex Rash    ACT Assessment Complete:  Yes:    Educational Status    Risk to Self: Risk to self Suicidal Ideation: No Suicidal Intent: No Is patient at risk for suicide?: No, but patient needs Medical Clearance Suicidal Plan?: No Access to Means: No What has been your use of drugs/alcohol within the last 12 months?: None Reported Previous Attempts/Gestures: Yes How many times?: 1 Other Self Harm Risks: None Reported Triggers for Past Attempts: Other (Comment) Intentional Self Injurious Behavior: None Family Suicide History: No Recent stressful life event(s): Other (Comment) Persecutory voices/beliefs?: Yes Depression: Yes Depression Symptoms: Tearfulness;Insomnia;Guilt;Feeling angry/irritable;Feeling worthless/self pity Substance abuse history and/or treatment for substance abuse?: No Suicide prevention information given to non-admitted patients: Yes  Risk to Others: Risk to Others Homicidal Ideation: No Thoughts of Harm to Others: No Current Homicidal Intent: No Current Homicidal Plan: No Access to Homicidal Means: No Identified Victim: NA History of harm to others?: No Assessment of Violence: None Noted Violent Behavior Description: None Does patient have access to weapons?: No Criminal Charges  Pending?: No Does patient have a court date: No  Abuse:    Prior Inpatient Therapy: Prior Inpatient Therapy Prior Inpatient Therapy: Yes Prior Therapy Dates: 2005 and 2008 Prior Therapy Facilty/Provider(s): [facility] and  [facility] Reason for Treatment: SI and Psych   Prior Outpatient Therapy: Prior Outpatient Therapy Prior Outpatient Therapy: Yes Prior Therapy Dates: Ongoing Prior Therapy Facilty/Provider(s): [name]-therapist Reason for Treatment: Medication Management and OPT  Additional Information: Additional Information 1:1 In Past 12 Months?: No CIRT Risk: No Elopement Risk: No Does patient have medical clearance?: Yes   Objective: Blood pressure 113/73, pulse 77, temperature 97.5 F (36.4 C), temperature source Oral, resp. rate 18, weight 121.11 kg (267 lb), SpO2 100.00%.Body mass index is 39.41 kg/(m^2). Results for orders placed during the hospital encounter of 08/02/13 (from the past 72 hour(s))  URINE RAPID DRUG SCREEN (HOSP PERFORMED)     Status: None   Collection Time    08/02/13  4:19 AM      Result Value Range   Opiates NONE DETECTED  NONE DETECTED   Cocaine NONE DETECTED  NONE DETECTED   Benzodiazepines NONE DETECTED  NONE DETECTED   Amphetamines NONE DETECTED  NONE DETECTED   Tetrahydrocannabinol NONE DETECTED  NONE DETECTED   Barbiturates NONE DETECTED  NONE DETECTED   Comment:            DRUG SCREEN FOR MEDICAL PURPOSES     ONLY.  IF CONFIRMATION IS NEEDED     FOR ANY PURPOSE, NOTIFY LAB     WITHIN 5 DAYS.                LOWEST DETECTABLE LIMITS     FOR URINE DRUG SCREEN     Drug Class       Cutoff (ng/mL)     Amphetamine      1000     Barbiturate      200     Benzodiazepine   194     Tricyclics       174     Opiates          300     Cocaine          300     THC              50  POCT PREGNANCY, URINE     Status: None   Collection Time    08/02/13  4:29 AM      Result Value Range   Preg Test, Ur NEGATIVE  NEGATIVE   Comment:            THE SENSITIVITY OF THIS     METHODOLOGY IS >24 mIU/mL  ACETAMINOPHEN LEVEL     Status: None   Collection Time    08/02/13  5:50 AM      Result Value Range   Acetaminophen (Tylenol), Serum <15.0  10 - 30 ug/mL   Comment:             THERAPEUTIC CONCENTRATIONS VARY     SIGNIFICANTLY. A RANGE OF 10-30     ug/mL MAY BE AN EFFECTIVE     CONCENTRATION FOR MANY PATIENTS.     HOWEVER, SOME ARE BEST TREATED     AT CONCENTRATIONS OUTSIDE THIS     RANGE.     ACETAMINOPHEN CONCENTRATIONS     >150 ug/mL AT 4 HOURS AFTER     INGESTION AND >50 ug/mL AT 12     HOURS AFTER INGESTION ARE  OFTEN ASSOCIATED WITH TOXIC     REACTIONS.  CBC     Status: None   Collection Time    08/02/13  5:50 AM      Result Value Range   WBC 8.0  4.0 - 10.5 K/uL   RBC 4.16  3.87 - 5.11 MIL/uL   Hemoglobin 12.7  12.0 - 15.0 g/dL   HCT 37.3  36.0 - 46.0 %   MCV 89.7  78.0 - 100.0 fL   MCH 30.5  26.0 - 34.0 pg   MCHC 34.0  30.0 - 36.0 g/dL   RDW 13.2  11.5 - 15.5 %   Platelets 277  150 - 400 K/uL  COMPREHENSIVE METABOLIC PANEL     Status: Abnormal   Collection Time    08/02/13  5:50 AM      Result Value Range   Sodium 138  137 - 147 mEq/L   Potassium 3.5 (*) 3.7 - 5.3 mEq/L   Chloride 102  96 - 112 mEq/L   CO2 23  19 - 32 mEq/L   Glucose, Bld 97  70 - 99 mg/dL   BUN 9  6 - 23 mg/dL   Creatinine, Ser 0.68  0.50 - 1.10 mg/dL   Calcium 8.8  8.4 - 10.5 mg/dL   Total Protein 6.7  6.0 - 8.3 g/dL   Albumin 3.8  3.5 - 5.2 g/dL   AST 19  0 - 37 U/L   ALT 27  0 - 35 U/L   Alkaline Phosphatase 53  39 - 117 U/L   Total Bilirubin 0.8  0.3 - 1.2 mg/dL   GFR calc non Af Amer >90  >90 mL/min   GFR calc Af Amer >90  >90 mL/min   Comment: (NOTE)     The eGFR has been calculated using the CKD EPI equation.     This calculation has not been validated in all clinical situations.     eGFR's persistently <90 mL/min signify possible Chronic Kidney     Disease.  ETHANOL     Status: None   Collection Time    08/02/13  5:50 AM      Result Value Range   Alcohol, Ethyl (B) <11  0 - 11 mg/dL   Comment:            LOWEST DETECTABLE LIMIT FOR     SERUM ALCOHOL IS 11 mg/dL     FOR MEDICAL PURPOSES ONLY  SALICYLATE LEVEL     Status: Abnormal    Collection Time    08/02/13  5:50 AM      Result Value Range   Salicylate Lvl <3.5 (*) 2.8 - 20.0 mg/dL   Labs are reviewed and are pertinent for Unremarkable.. Medication reviewed.  Will Stop trazodone and start Vistaril for anxiety and sleep  Current Facility-Administered Medications  Medication Dose Route Frequency Provider Last Rate Last Dose  . acetaminophen (TYLENOL) tablet 650 mg  650 mg Oral Q4H PRN Linus Mako, PA-C      . cholecalciferol (VITAMIN D) tablet 1,000 Units  1,000 Units Oral Daily Linus Mako, PA-C   1,000 Units at 08/02/13 1323  . lisinopril (PRINIVIL,ZESTRIL) tablet 20 mg  20 mg Oral Daily Linus Mako, PA-C   20 mg at 08/02/13 1323   And  . hydrochlorothiazide (HYDRODIURIL) tablet 25 mg  25 mg Oral Daily Linus Mako, PA-C   25 mg at 08/02/13 1323  . hydrOXYzine (ATARAX/VISTARIL) tablet 25 mg  25 mg  Oral BID WC Linus Mako, PA-C   25 mg at 08/03/13 0846  . hydrOXYzine (ATARAX/VISTARIL) tablet 50 mg  50 mg Oral QHS Linus Mako, PA-C   50 mg at 08/02/13 2112  . ibuprofen (ADVIL,MOTRIN) tablet 600 mg  600 mg Oral Q8H PRN Linus Mako, PA-C   600 mg at 08/02/13 1209  . metFORMIN (GLUCOPHAGE) tablet 500 mg  500 mg Oral BID WC Linus Mako, PA-C   500 mg at 08/03/13 0846  . nicotine (NICODERM CQ - dosed in mg/24 hours) patch 21 mg  21 mg Transdermal Daily Tiffany Marilu Favre, PA-C      . ondansetron (ZOFRAN) tablet 4 mg  4 mg Oral Q8H PRN Linus Mako, PA-C      . perphenazine (TRILAFON) tablet 8 mg  8 mg Oral QHS Delfin Gant, NP   8 mg at 08/02/13 2112  . venlafaxine XR (EFFEXOR-XR) 24 hr capsule 225 mg  225 mg Oral Q breakfast Linus Mako, PA-C   225 mg at 08/03/13 6812   Current Outpatient Prescriptions  Medication Sig Dispense Refill  . cholecalciferol (VITAMIN D) 1000 UNITS tablet Take 1,000 Units by mouth daily.      [name] etonogestrel (NEXPLANON) 68 MG IMPL implant Inject 1 each into the skin once.      . fluticasone  (FLONASE) 50 MCG/ACT nasal spray Place 1 spray into both nostrils daily as needed for allergies or rhinitis.      . hydrOXYzine (VISTARIL) 25 MG capsule Take 25-50 mg by mouth 3 (three) times daily. Take one capsule in the morning, and one at lunch and 2 at bedtime      . lisinopril-hydrochlorothiazide (PRINZIDE,ZESTORETIC) 20-25 MG per tablet Take 1 tablet by mouth daily.      . metFORMIN (GLUCOPHAGE) 500 MG tablet Take 500 mg by mouth 2 (two) times daily with a meal.      . venlafaxine XR (EFFEXOR-XR) 75 MG 24 hr capsule Take 225 mg by mouth daily with breakfast.       Review of Physical examination done by the Stephanie 08/02/13 AT 03 am is WNL. Psychiatric Specialty Exam:     Blood pressure 113/73, pulse 77, temperature 97.5 F (36.4 C), temperature source Oral, resp. rate 18, weight 121.11 kg (267 lb), SpO2 100.00%.Body mass index is 39.41 kg/(m^2).  General Appearance: Casual  Eye Contact::  Good  Speech:  Clear and Coherent and Normal Rate  Volume:  Normal  Mood:  Anxious, Depressed, Hopeless and Irritable  Affect:  Congruent, Depressed and Tearful  Thought Process:  Coherent, Goal Directed and Intact  Orientation:  Full (Time, Place, and Person)  Thought Content:  "No hallucinations; much better"  Suicidal Thoughts:  No  Homicidal Thoughts:  No  Memory:  Immediate;   Good Recent;   Good Remote;   Good  Judgement:  Fair  Insight:  Present  Psychomotor Activity:  Normal  Concentration:  Good  Recall:  NA  Fund of Knowledge:GOOD, STARTS AND COMPLETES SENTENCES  Language: English is her primary language  Akathisia:  NA  Handed:  Right  AIMS (if indicated):     Assets:  Desire for Improvement  Sleep:      Treatment Plan Summary:  Consult and face to face interview with Dr Adele Schilder Follow up with primary outpatient provider  Disposition:  Discharge home.  Patient to follow up with her primary outpatient provider.    Discharge Assessment     Demographic Factors:  Female  Total  Time spent with patient: 15 minutes    Mental Status Per Nursing Assessment::   On Admission:     Current Mental Status by Physician: Patient denies suicidal/homicidal ideation, psychosis, and paranoia  Loss Factors: NA  Historical Factors: NA  Risk Reduction Factors:   Positive therapeutic relationship  Continued Clinical Symptoms:  Anxiety   Cognitive Features That Contribute To Risk:  N/A    Suicide Risk:  Minimal: No identifiable suicidal ideation.  Patients presenting with no risk factors but with morbid ruminations; may be classified as minimal risk based on the severity of the depressive symptoms  Discharge Diagnoses:     Plan Of Care/Follow-up recommendations:  Activity:  Resume usual Activitiy Diet:  Resume usual Diet  Is patient on multiple antipsychotic therapies at discharge:  No   Has Patient had three or more failed trials of antipsychotic monotherapy by history:  No  Recommended Plan for Multiple Antipsychotic Therapies: NA  Rankin, Shuvon   FNP-BC 08/03/2013 11:30 AM  I have personally seen the patient and agreed with the findings and involved in the treatment plan. Berniece Andreas, MD

## 2013-08-03 NOTE — ED Notes (Signed)
Up to the bathroom 

## 2013-08-03 NOTE — ED Notes (Addendum)
Eating lunch, pt will be dc'd after lunch, delays explained.

## 2013-08-03 NOTE — Discharge Instructions (Signed)
Bipolar Disorder Bipolar disorder is a mental illness. The term bipolar disorder actually is used to describe a group of disorders that all share varying degrees of emotional highs and lows that can interfere with daily functioning, such as work, school, or relationships. Bipolar disorder also can lead to drug abuse, hospitalization, and suicide. The emotional highs of bipolar disorder are periods of elation or irritability and high energy. These highs can range from a mild form (hypomania) to a severe form (mania). People experiencing episodes of hypomania may appear energetic, excitable, and highly productive. People experiencing mania may behave impulsively or erratically. They often make poor decisions. They may have difficulty sleeping. The most severe episodes of mania can involve having very distorted beliefs or perceptions about the world and seeing or hearing things that are not real (psychotic delusions and hallucinations).  The emotional lows of bipolar disorder (depression) also can range from mild to severe. Severe episodes of bipolar depression can involve psychotic delusions and hallucinations. Sometimes people with bipolar disorder experience a state of mixed mood. Symptoms of hypomania or mania and depression are both present during this mixed-mood episode. SIGNS AND SYMPTOMS There are signs and symptoms of the episodes of hypomania and mania as well as the episodes of depression. The signs and symptoms of hypomania and mania are similar but vary in severity. They include:  Inflated self-esteem or feeling of increased self-confidence.  Decreased need for sleep.  Unusual talkativeness (rapid or pressured speech) or the feeling of a need to keep talking.  Sensation of racing thoughts or constant talking, with quick shifts between topics that may or may not be related (flight of ideas).  Decreased ability to focus or concentrate.  Increased purposeful activity, such as work, studies,  or social activity, or nonproductive activity, such as pacing, squirming and fidgeting, or finger and toe tapping.  Impulsive behavior and use of poor judgment, resulting in high-risk activities, such as having unprotected sex or spending excessive amounts of money. Signs and symptoms of depression include the following:   Feelings of sadness, hopelessness, or helplessness.  Frequent or uncontrollable episodes of crying.  Lack of feeling anything or caring about anything.  Difficulty sleeping or sleeping too much.  Inability to enjoy the things you used to enjoy.   Desire to be alone all the time.   Feelings of guilt or worthlessness.  Lack of energy or motivation.   Difficulty concentrating, remembering, or making decisions.  Change in appetite or weight beyond normal fluctuations.  Thoughts of death or the desire to harm yourself. DIAGNOSIS  Bipolar disorder is diagnosed through an assessment by your caregiver. Your caregiver will ask questions about your emotional episodes. There are two main types of bipolar disorder. People with type I bipolar disorder have manic episodes with or without depressive episodes. People with type II bipolar disorder have hypomanic episodes and major depressive episodes, which are more serious than mild depression. The type of bipolar disorder you have can make an important difference in how your illness is monitored and treated. Your caregiver may ask questions about your medical history and use of alcohol or drugs, including prescription medication. Certain medical conditions and substances also can cause emotional highs and lows that resemble bipolar disorder (secondary bipolar disorder).  TREATMENT  Bipolar disorder is a long-term illness. It is best controlled with continuous treatment rather than treatment only when symptoms occur. The following treatments can be prescribed for bipolar disorders:  Medication Medication can be prescribed by  a doctor  that is an expert in treating mental disorders (psychiatrists). Medications called mood stabilizers are usually prescribed to help control the illness. Other medications are sometimes added if symptoms of mania, depression, or psychotic delusions and hallucinations occur despite the use of a mood stabilizer.  Talk therapy Some forms of talk therapy are helpful in providing support, education, and guidance. A combination of medication and talk therapy is best for managing the disorder over time. A procedure in which electricity is applied to your brain through your scalp (electroconvulsive therapy) is used in cases of severe mania when medication and talk therapy do not work or work too slowly. Document Released: 09/18/2000 Document Revised: 10/07/2012 Document Reviewed: 07/08/2012 [facility] Patient Information 2014 [place], [place].  Depression, Adult Depression refers to feeling sad, low, down in the dumps, blue, gloomy, or empty. In general, there are two kinds of depression: 1. Depression that we all experience from time to time because of upsetting life experiences, including the loss of a job or the ending of a relationship (normal sadness or normal grief). This kind of depression is considered normal, is short lived, and resolves within a few days to 2 weeks. (Depression experienced after the loss of a loved one is called bereavement. Bereavement often lasts longer than 2 weeks but normally gets better with time.) 2. Clinical depression, which lasts longer than normal sadness or normal grief or interferes with your ability to function at home, at work, and in school. It also interferes with your personal relationships. It affects almost every aspect of your life. Clinical depression is an illness. Symptoms of depression also can be caused by conditions other than normal sadness and grief or clinical depression. Examples of these conditions are listed as follows:  Physical illness Some  physical illnesses, including underactive thyroid gland (hypothyroidism), severe anemia, specific types of cancer, diabetes, uncontrolled seizures, heart and lung problems, strokes, and chronic pain are commonly associated with symptoms of depression.  Side effects of some prescription medicine In some people, certain types of prescription medicine can cause symptoms of depression.  Substance abuse Abuse of alcohol and illicit drugs can cause symptoms of depression. SYMPTOMS Symptoms of normal sadness and normal grief include the following:  Feeling sad or crying for short periods of time.  Not caring about anything (apathy).  Difficulty sleeping or sleeping too much.  No longer able to enjoy the things you used to enjoy.  Desire to be by oneself all the time (social isolation).  Lack of energy or motivation.  Difficulty concentrating or remembering.  Change in appetite or weight.  Restlessness or agitation. Symptoms of clinical depression include the same symptoms of normal sadness or normal grief and also the following symptoms:  Feeling sad or crying all the time.  Feelings of guilt or worthlessness.  Feelings of hopelessness or helplessness.  Thoughts of suicide or the desire to harm yourself (suicidal ideation).  Loss of touch with reality (psychotic symptoms). Seeing or hearing things that are not real (hallucinations) or having false beliefs about your life or the people around you (delusions and paranoia). DIAGNOSIS  The diagnosis of clinical depression usually is based on the severity and duration of the symptoms. Your caregiver also will ask you questions about your medical history and substance use to find out if physical illness, use of prescription medicine, or substance abuse is causing your depression. Your caregiver also may order blood tests. TREATMENT  Typically, normal sadness and normal grief do not require treatment. However, sometimes antidepressant  medicine is prescribed for bereavement to ease the depressive symptoms until they resolve. The treatment for clinical depression depends on the severity of your symptoms but typically includes antidepressant medicine, counseling with a mental health professional, or a combination of both. Your caregiver will help to determine what treatment is best for you. Depression caused by physical illness usually goes away with appropriate medical treatment of the illness. If prescription medicine is causing depression, talk with your caregiver about stopping the medicine, decreasing the dose, or substituting another medicine. Depression caused by abuse of alcohol or illicit drugs abuse goes away with abstinence from these substances. Some adults need professional help in order to stop drinking or using drugs. SEEK IMMEDIATE CARE IF:  You have thoughts about hurting yourself or others.  You lose touch with reality (have psychotic symptoms).  You are taking medicine for depression and have a serious side effect. FOR MORE INFORMATION National Alliance on Mental Illness: www.nami.[employer]: https://carter.com/ Document Released: 06/09/2000 Document Revised: 12/12/2011 Document Reviewed: 09/11/2011 [facility] Patient Information 2014 [place].  Mood Disorders Mood disorders are conditions that affect the way a person feels emotionally. The main mood disorders include:  Depression.  Bipolar disorder.  Dysthymia. Dysthymia is a mild, lasting (chronic) depression. Symptoms of dysthymia are similar to depression, but not as severe.  Cyclothymia. Cyclothymia includes mood swings, but the highs and lows are not as severe as they are in bipolar disorder. Symptoms of cyclothymia are similar to those of bipolar disorder, but less extreme. CAUSES  Mood disorders are probably caused by a combination of factors. People with mood disorders seem to have physical and chemical changes  in their brains. Mood disorders run in families, so there may be genetic causes. Severe trauma or stressful life events may also increase the risk of mood disorders.  SYMPTOMS  Symptoms of mood disorders depend on the specific type of condition. Depression symptoms include:  Feeling sad, worthless, or hopeless.  Negative thoughts.  Inability to enjoy one's usual activities.  Low energy.  Sleeping too much or too little.  Appetite changes.  Crying.  Concentration problems.  Thoughts of harming oneself. Bipolar disorder symptoms include:  Periods of depression (see above symptoms).  Mood swings, from sadness and depression, to abnormal elation and excitement.  Periods of mania:  Racing thoughts.  Fast speech.  Poor judgment, and careless, dangerous choices.  Decreased need for sleep.  Risky behavior.  Difficulty concentrating.  Irritability.  Increased energy.  Increased sex drive. DIAGNOSIS  There are no blood tests or X-rays that can confirm a mood disorder. However, your caregiver may choose to run some tests to make sure that there is not another physical cause for your symptoms. A mood disorder is usually diagnosed after an in-depth interview with a caregiver. TREATMENT  Mood disorders can be treated with one or more of the following:  Medicine. This may include antidepressants, mood-stabilizers, or anti-psychotics.  Psychotherapy (talk therapy).  Cognitive behavioral therapy. You are taught to recognize negative thoughts and behavior patterns, and replace them with healthy thoughts and behaviors.  Electroconvulsive therapy. For very severe cases of deep depression, a series of treatments in which an electrical current is applied to the brain.  Vagus nerve stimulation. A pulse of electricity is applied to a portion of the brain.  Transcranial magnetic stimulation. Powerful magnets are placed on the head that produce electrical  currents.  Hospitalization. In severe situations, or when someone is having serious thoughts of harming  him or herself, hospitalization may be necessary in order to keep the person safe. This is also done to quickly start and monitor treatment. HOME CARE INSTRUCTIONS   Take your medicine exactly as directed.  Attend all of your therapy sessions.  Try to eat regular, healthy meals.  Exercise daily. Exercise may improve mood symptoms.  Get good sleep.  Do not drink alcohol or use pot or other drugs. These can worsen mood symptoms and cause anxiety and psychosis.  Tell your caregiver if you develop any side effects, such as feeling sick to your stomach (nauseous), dry mouth, dizziness, constipation, drowsiness, tremor, weight gain, or sexual symptoms. He or she may suggest things you can do to improve symptoms.  Learn ways to cope with the stress of having a chronic illness. This includes yoga, meditation, tai chi, or participating in a support group.  Drink enough water to keep your urine clear or pale yellow. Eat a high-fiber diet. These habits may help you avoid constipation from your medicine. SEEK IMMEDIATE MEDICAL CARE IF:  Your mood worsens.  You have thoughts of hurting yourself or others.  You cannot care for yourself.  You develop the sensation of hearing or seeing something that is not actually present (auditory or visual hallucinations).  You develop abnormal thoughts. Document Released: 04/09/2009 Document Revised: 09/04/2011 Document Reviewed: 04/09/2009 [facility] Patient Information 2014 [place], [place].  Major Depressive Disorder Major depressive disorder (MDD) is a mental illness. It also may be called clinical depression or unipolar depression. MDD usually causes feelings of sadness, hopelessness, or helplessness. Some people with MDD do not feel particularly sad but lose interest in doing things they used to enjoy (anhedonia). MDD also can cause physical symptoms.  It can interfere with work, school, relationships, and other normal everyday activities. MDD varies in severity but is longer lasting and more serious than the sadness we all feel from time to time in our lives. MDD often is triggered by stressful life events or major life changes. Examples of these triggers include divorce, loss of your job or home, a move, and the death of a family member or close friend. Sometimes MDD occurs for no obvious reason at all. People who have family members with MDD or bipolar disorder are at higher risk for developing MDD, with or without life stressors. MDD can occur at any age. It may occur just once in your life (single episode MDD). It may occur multiple times (recurrent MDD). SYMPTOMS People with MDD have either anhedonia or depressed mood on nearly a daily basis for at least 2 weeks or longer. Symptoms of depressed mood include:  Feelings of sadness (blue or down in the dumps) or emptiness.  Feelings of hopelessness or helplessness.  Tearfulness or episodes of crying (may be observed by others).  Irritability (children and adolescents). In addition to depressed mood or anhedonia or both, people with MDD have at least four of the following symptoms:  Difficulty sleeping or sleeping too much.   Significant change (increase or decrease) in appetite or weight.   Lack of energy or motivation.  Feelings of guilt and worthlessness.   Difficulty concentrating, remembering, or making decisions.  Unusually slow movement (psychomotor retardation) or restlessness (as observed by others).   Recurrent wishes for death, recurrent thoughts of self-harm (suicide), or a suicide attempt. People with MDD commonly have persistent negative thoughts about themselves, other people, and the world. People with severe MDD may experiencedistorted beliefs or perceptions about the world (psychotic delusions). They also  may see or hear things that are not real (psychotic  hallucinations). DIAGNOSIS MDD is diagnosed through an assessment by your caregiver. Your caregiver will ask aboutaspects of your daily life, such as mood,sleep, and appetite, to see if you have the diagnostic symptoms of MDD. Your caregiver may ask about your medical history and use of alcohol or drugs, including prescription medications. Your caregiver also may do a physical exam and blood work. This is because certain medical conditions and the use of certain substances can cause MDD-like symptoms (secondary depression). Your caregiver also may refer you to a mental health specialist for further evaluation and treatment. TREATMENT It is important to recognize the symptoms of MDD and seek treatment. The following treatments can be prescribed for MDD:   Medication Antidepressant medications usually are prescribed. Antidepressant medications are thought to correct chemical imbalances in the brain that are commonly associated with MDD. Other types of medication may be added if MDD symptoms do not respond to antidepressant medications alone or if psychotic delusions or hallucinations occur.  Talk therapy Talk therapy can be helpful in treating MDD by providing support, education, and guidance. Certain types of talk therapy also can help with negative thinking (cognitive behavioral therapy) and with relationship issues that trigger MDD (interpersonal therapy). A mental health specialist can help determine which treatment is best for you. Most people with MDD do well with a combination of medication and talk therapy. Treatments involving electrical stimulation of the brain can be used in situations with extremely severe symptoms or when medication and talk therapy do not work over time. These treatments include electroconvulsive therapy, transcranial magnetic stimulation, and vagal nerve stimulation. Document Released: 10/07/2012 Document Reviewed: 10/07/2012 [facility] Patient Information 2014 [place],  [place].

## 2013-09-04 ENCOUNTER — Ambulatory Visit: Payer: No Typology Code available for payment source | Attending: Internal Medicine | Admitting: Internal Medicine

## 2013-09-04 VITALS — BP 136/82 | HR 106 | Temp 98.9°F | Resp 16 | Ht 69.0 in | Wt 268.0 lb

## 2013-09-04 DIAGNOSIS — E119 Type 2 diabetes mellitus without complications: Secondary | ICD-10-CM | POA: Insufficient documentation

## 2013-09-04 DIAGNOSIS — I10 Essential (primary) hypertension: Secondary | ICD-10-CM | POA: Insufficient documentation

## 2013-09-04 DIAGNOSIS — Z760 Encounter for issue of repeat prescription: Secondary | ICD-10-CM | POA: Insufficient documentation

## 2013-09-04 LAB — COMPREHENSIVE METABOLIC PANEL
ALT: 14 U/L (ref 0–35)
AST: 15 U/L (ref 0–37)
Albumin: 4.1 g/dL (ref 3.5–5.2)
Alkaline Phosphatase: 52 U/L (ref 39–117)
BILIRUBIN TOTAL: 1.1 mg/dL (ref 0.2–1.2)
BUN: 14 mg/dL (ref 6–23)
CO2: 26 meq/L (ref 19–32)
Calcium: 9.7 mg/dL (ref 8.4–10.5)
Chloride: 104 mEq/L (ref 96–112)
Creat: 0.68 mg/dL (ref 0.50–1.10)
Glucose, Bld: 109 mg/dL — ABNORMAL HIGH (ref 70–99)
Potassium: 4.1 mEq/L (ref 3.5–5.3)
Sodium: 140 mEq/L (ref 135–145)
Total Protein: 6.2 g/dL (ref 6.0–8.3)

## 2013-09-04 LAB — LIPID PANEL
CHOL/HDL RATIO: 3.3 ratio
Cholesterol: 160 mg/dL (ref 0–200)
HDL: 49 mg/dL (ref >39)
LDL Cholesterol: 91 mg/dL (ref 0–99)
Triglycerides: 98 mg/dL (ref <150)
VLDL: 20 mg/dL (ref 0–40)

## 2013-09-04 LAB — CBC WITH DIFFERENTIAL/PLATELET
BASOS ABS: 0 10*3/uL (ref 0.0–0.1)
BASOS PCT: 0 % (ref 0–1)
EOS PCT: 7 % — AB (ref 0–5)
Eosinophils Absolute: 0.6 10*3/uL (ref 0.0–0.7)
HCT: 39 % (ref 36.0–46.0)
Hemoglobin: 13 g/dL (ref 12.0–15.0)
Lymphocytes Relative: 33 % (ref 12–46)
Lymphs Abs: 2.7 10*3/uL (ref 0.7–4.0)
MCH: 29.6 pg (ref 26.0–34.0)
MCHC: 33.3 g/dL (ref 30.0–36.0)
MCV: 88.8 fL (ref 78.0–100.0)
Monocytes Absolute: 0.5 10*3/uL (ref 0.1–1.0)
Monocytes Relative: 6 % (ref 3–12)
Neutro Abs: 4.4 10*3/uL (ref 1.7–7.7)
Neutrophils Relative %: 54 % (ref 43–77)
PLATELETS: 295 10*3/uL (ref 150–400)
RBC: 4.39 MIL/uL (ref 3.87–5.11)
RDW: 13.7 % (ref 11.5–15.5)
WBC: 8.1 10*3/uL (ref 4.0–10.5)

## 2013-09-04 LAB — HEMOGLOBIN A1C
Hgb A1c MFr Bld: 5.7 % — ABNORMAL HIGH (ref <5.7)
Mean Plasma Glucose: 117 mg/dL — ABNORMAL HIGH (ref <117)

## 2013-09-04 LAB — TSH: TSH: 3.548 u[IU]/mL (ref 0.350–4.500)

## 2013-09-04 MED ORDER — FLUTICASONE PROPIONATE 50 MCG/ACT NA SUSP
1.0000 | Freq: Every day | NASAL | Status: DC | PRN
Start: 2013-09-04 — End: 2016-08-14

## 2013-09-04 MED ORDER — PERPHENAZINE 8 MG PO TABS: 8.0000 mg | ORAL_TABLET | Freq: Every day | ORAL | Status: DC

## 2013-09-04 MED ORDER — VENLAFAXINE HCL ER 75 MG PO CP24: 225.0000 mg | ORAL_CAPSULE | Freq: Every day | ORAL | Status: DC

## 2013-09-04 MED ORDER — METFORMIN HCL 500 MG PO TABS: 500.0000 mg | ORAL_TABLET | Freq: Two times a day (BID) | ORAL | Status: DC

## 2013-09-04 MED ORDER — LISINOPRIL-HYDROCHLOROTHIAZIDE 20-25 MG PO TABS: 1.0000 | ORAL_TABLET | Freq: Every day | ORAL | Status: DC

## 2013-09-04 MED ORDER — HYDROXYZINE PAMOATE 25 MG PO CAPS: 25.0000 mg | ORAL_CAPSULE | Freq: Three times a day (TID) | ORAL | Status: DC | PRN

## 2013-09-04 NOTE — Progress Notes (Signed)
Patient ID: [name], female   DOB: [birth year], 48 y.o.   MRN: [id number]  CC: Refill medicines  HPI: Patient is 48 year old female, very pleasant, presents to clinic for followup requesting refill of medications. She explains she has not been taking diabetes her blood pressure medicines for over 2 weeks and has not been checking her blood pressure or sugar levels. She denies chest pain or shortness of breath, no specific abdominal or urinary concerns. She has chronic right shoulder pain and would like a referral to specialist. Her pain is intermittent, located in right shoulder area, nonradiating, 5/10 in severity when present, denies any specific limitations in physical therapy.  Allergies  Allergen Reactions  . Latex Rash   Past Medical History  Diagnosis Date  . Diabetes mellitus   . Hypertension   . Depression   . Obesity   . PTSD (post-traumatic stress disorder)    Current Outpatient Prescriptions on File Prior to Visit  Medication Sig Dispense Refill  . cholecalciferol (VITAMIN D) 1000 UNITS tablet Take 1,000 Units by mouth daily.      [name] etonogestrel (NEXPLANON) 68 MG IMPL implant Inject 1 each into the skin once.       No current facility-administered medications on file prior to visit.   Family History  Problem Relation Age of Onset  . Diabetes Mother   . Depression Mother   . Hypertension Mother   . Stroke Paternal Grandmother   . Stroke Paternal Grandfather    History   Social History  . Marital Status: Single    Spouse Name: N/A    Number of Children: N/A  . Years of Education: N/A   Occupational History  . Not on file.   Social History Main Topics  . Smoking status: Current Every Day Smoker  . Smokeless tobacco: Not on file  . Alcohol Use: Yes     Comment: occasional  . Drug Use: Yes    Special: Marijuana  . Sexual Activity: Not on file   Other Topics Concern  . Not on file   Social History Narrative  . No narrative on file    Review of  Systems  Constitutional: Negative for fever, chills, diaphoresis, activity change, appetite change and fatigue.  HENT: Negative for ear pain, nosebleeds, congestion, facial swelling, rhinorrhea, neck pain, neck stiffness and ear discharge.   Eyes: Negative for pain, discharge, redness, itching and visual disturbance.  Respiratory: Negative for cough, choking, chest tightness, shortness of breath, wheezing and stridor.   Cardiovascular: Negative for chest pain, palpitations and leg swelling.  Gastrointestinal: Negative for abdominal distention.  Genitourinary: Negative for dysuria, urgency, frequency, hematuria, flank pain, decreased urine volume, difficulty urinating and dyspareunia.  Musculoskeletal: Negative for back pain, joint swelling, arthralgias and gait problem.  Neurological: Negative for dizziness, tremors, seizures, syncope, facial asymmetry, speech difficulty, weakness, light-headedness, numbness and headaches.  Hematological: Negative for adenopathy. Does not bruise/bleed easily.  Psychiatric/Behavioral: Negative for hallucinations, behavioral problems, confusion, dysphoric mood, decreased concentration and agitation.    Objective:   Filed Vitals:   09/04/13 0904  BP: 136/82  Pulse: 106  Temp: 98.9 F (37.2 C)  Resp: 16    Physical Exam  Constitutional: Appears well-developed and well-nourished. No distress.  HENT: Normocephalic. External right and left ear normal. Oropharynx is clear and moist.  Eyes: Conjunctivae and EOM are normal. PERRLA, no scleral icterus.  Neck: Normal ROM. Neck supple. No JVD. No tracheal deviation. No thyromegaly.  CVS: RRR, S1/S2 +, no murmurs, no  gallops, no carotid bruit.  Pulmonary: Effort and breath sounds normal, no stridor, rhonchi, wheezes, rales.  Abdominal: Soft. BS +,  no distension, tenderness, rebound or guarding.  Musculoskeletal: Normal range of motion. Tenderness around right shoulder blade area  Lymphadenopathy: No  lymphadenopathy noted, cervical, inguinal. Neuro: Alert. Normal reflexes, muscle tone coordination. No cranial nerve deficit. Skin: Skin is warm and dry. No rash noted. Not diaphoretic. No erythema. No pallor.  Psychiatric: Normal mood and affect. Behavior, judgment, thought content normal.   Lab Results  Component Value Date   WBC 8.0 08/02/2013   HGB 12.7 08/02/2013   HCT 37.3 08/02/2013   MCV 89.7 08/02/2013   PLT 277 08/02/2013   Lab Results  Component Value Date   CREATININE 0.68 08/02/2013   BUN 9 08/02/2013   NA 138 08/02/2013   K 3.5* 08/02/2013   CL 102 08/02/2013   CO2 23 08/02/2013    No results found for this basename: HGBA1C   Lipid Panel     Component Value Date/Time   CHOL 156 05/04/2006   TRIG 75 05/04/2006   HDL 54 05/04/2006   LDLCALC 87 05/04/2006       Assessment and plan:   Patient Active Problem List   Diagnosis Date Noted  . HYPERTENSION - will check electrolyte panel today, lipid panel and TSH. Provide refills on blood pressure medicine  05/15/2006  .  hyperglycemia  - patient on metformin but no recent A1c. Will check A1c today, continue metformin as originally prescribed and will readjust her dose and regimen once A1c is available. Extensive education provided on diet and exercise, foot care. 05/15/2006  .  depression  - with anxiety disorder, patient follows at [facility]  05/15/2006

## 2013-09-04 NOTE — Patient Instructions (Signed)

## 2013-09-04 NOTE — Progress Notes (Signed)
Pt is here to establish care. For two weeks pt has been out of her medications. Pt has chronic back pain.

## 2013-09-16 ENCOUNTER — Ambulatory Visit: Payer: Self-pay | Attending: Internal Medicine

## 2013-12-05 ENCOUNTER — Ambulatory Visit: Payer: Self-pay | Admitting: Internal Medicine

## 2014-01-07 ENCOUNTER — Ambulatory Visit: Payer: Self-pay | Admitting: Internal Medicine

## 2014-01-15 ENCOUNTER — Ambulatory Visit: Payer: Self-pay | Attending: Internal Medicine | Admitting: Internal Medicine

## 2014-01-15 ENCOUNTER — Encounter: Payer: Self-pay | Admitting: Internal Medicine

## 2014-01-15 VITALS — BP 117/85 | HR 61 | Temp 98.8°F | Resp 16 | Wt 288.6 lb

## 2014-01-15 DIAGNOSIS — F17200 Nicotine dependence, unspecified, uncomplicated: Secondary | ICD-10-CM | POA: Insufficient documentation

## 2014-01-15 DIAGNOSIS — Z760 Encounter for issue of repeat prescription: Secondary | ICD-10-CM | POA: Insufficient documentation

## 2014-01-15 DIAGNOSIS — F411 Generalized anxiety disorder: Secondary | ICD-10-CM | POA: Insufficient documentation

## 2014-01-15 DIAGNOSIS — Z79899 Other long term (current) drug therapy: Secondary | ICD-10-CM | POA: Insufficient documentation

## 2014-01-15 DIAGNOSIS — E119 Type 2 diabetes mellitus without complications: Secondary | ICD-10-CM | POA: Insufficient documentation

## 2014-01-15 DIAGNOSIS — F4310 Post-traumatic stress disorder, unspecified: Secondary | ICD-10-CM | POA: Insufficient documentation

## 2014-01-15 DIAGNOSIS — E139 Other specified diabetes mellitus without complications: Secondary | ICD-10-CM

## 2014-01-15 DIAGNOSIS — E089 Diabetes mellitus due to underlying condition without complications: Secondary | ICD-10-CM

## 2014-01-15 DIAGNOSIS — E669 Obesity, unspecified: Secondary | ICD-10-CM | POA: Insufficient documentation

## 2014-01-15 DIAGNOSIS — I10 Essential (primary) hypertension: Secondary | ICD-10-CM | POA: Insufficient documentation

## 2014-01-15 LAB — GLUCOSE, POCT (MANUAL RESULT ENTRY): POC Glucose: 104 mg/dl — AB (ref 70–99)

## 2014-01-15 LAB — POCT GLYCOSYLATED HEMOGLOBIN (HGB A1C): Hemoglobin A1C: 5.4

## 2014-01-15 MED ORDER — HYDROXYZINE PAMOATE 25 MG PO CAPS: 25.0000 mg | ORAL_CAPSULE | Freq: Three times a day (TID) | ORAL | Status: DC | PRN

## 2014-01-15 MED ORDER — NICOTINE 21 MG/24HR TD PT24: 21.0000 mg | MEDICATED_PATCH | Freq: Every day | TRANSDERMAL | Status: DC

## 2014-01-15 MED ORDER — LISINOPRIL-HYDROCHLOROTHIAZIDE 20-25 MG PO TABS: 1.0000 | ORAL_TABLET | Freq: Every day | ORAL | Status: DC

## 2014-01-15 MED ORDER — VENLAFAXINE HCL ER 75 MG PO CP24: 225.0000 mg | ORAL_CAPSULE | Freq: Every day | ORAL | Status: DC

## 2014-01-15 MED ORDER — PERPHENAZINE 8 MG PO TABS: 8.0000 mg | ORAL_TABLET | Freq: Every day | ORAL | Status: DC

## 2014-01-15 MED ORDER — METFORMIN HCL 500 MG PO TABS: 500.0000 mg | ORAL_TABLET | Freq: Every day | ORAL | Status: DC

## 2014-01-15 NOTE — Progress Notes (Signed)
MRN: [id number] Name: [name]  Sex: female Age: 48 y.o. DOB: May 05, 1966  Allergies: Latex  Chief Complaint  [name] presents with  . Follow-up    dm    HPI: [name] is 48 y.o. female who history of anxiety depression hypertension impaired fasting glucose her comes today and for followup and requesting refill on her medication, [name] is anxious and tearful, history of long-term Klonopin use had withdrawals in the past, currently following up with the therapist, she's requesting refill on her medications for anxiety she takes Effexor, hydroxyzine, Trilafon which helps her with the sleep, she denies any SI or HI, she still smokes cigarettes, advised [name] to quit smoking. Past Medical History  Diagnosis Date  . Diabetes mellitus   . Hypertension   . Depression   . Obesity   . PTSD (post-traumatic stress disorder)     Past Surgical History  Procedure Laterality Date  . Induced abortion        Medication List       This list is accurate as of: 01/15/14 12:59 PM.  Always use your most recent med list.               cholecalciferol 1000 UNITS tablet  Commonly known as:  VITAMIN D  Take 1,000 Units by mouth daily.     fluticasone 50 MCG/ACT nasal spray  Commonly known as:  FLONASE  Place 1 spray into both nostrils daily as needed for allergies or rhinitis.     hydrOXYzine 25 MG capsule  Commonly known as:  VISTARIL  Take 1 capsule (25 mg total) by mouth 3 (three) times daily as needed. Take one capsule in the morning, and one at lunch and 2 at bedtime as needed     lisinopril-hydrochlorothiazide 20-25 MG per tablet  Commonly known as:  PRINZIDE,ZESTORETIC  Take 1 tablet by mouth daily.     metFORMIN 500 MG tablet  Commonly known as:  GLUCOPHAGE  Take 1 tablet (500 mg total) by mouth daily with breakfast.     NEXPLANON 68 MG Impl implant  Generic drug:  etonogestrel  Inject 1 each into the skin once.     nicotine 21 mg/24hr patch  Commonly  known as:  NICODERM CQ  Place 1 patch (21 mg total) onto the skin daily.     perphenazine 8 MG tablet  Commonly known as:  TRILAFON  Take 1 tablet (8 mg total) by mouth at bedtime.     venlafaxine XR 75 MG 24 hr capsule  Commonly known as:  EFFEXOR-XR  Take 3 capsules (225 mg total) by mouth daily with breakfast.        Meds ordered this encounter  Medications  . hydrOXYzine (VISTARIL) 25 MG capsule    Sig: Take 1 capsule (25 mg total) by mouth 3 (three) times daily as needed. Take one capsule in the morning, and one at lunch and 2 at bedtime as needed    Dispense:  90 capsule    Refill:  3  . lisinopril-hydrochlorothiazide (PRINZIDE,ZESTORETIC) 20-25 MG per tablet    Sig: Take 1 tablet by mouth daily.    Dispense:  30 tablet    Refill:  5  . metFORMIN (GLUCOPHAGE) 500 MG tablet    Sig: Take 1 tablet (500 mg total) by mouth daily with breakfast.    Dispense:  30 tablet    Refill:  5  . perphenazine (TRILAFON) 8 MG tablet    Sig: Take 1 tablet (8 mg  total) by mouth at bedtime.    Dispense:  30 tablet    Refill:  3  . venlafaxine XR (EFFEXOR-XR) 75 MG 24 hr capsule    Sig: Take 3 capsules (225 mg total) by mouth daily with breakfast.    Dispense:  90 capsule    Refill:  3  . nicotine (NICODERM CQ) 21 mg/24hr patch    Sig: Place 1 patch (21 mg total) onto the skin daily.    Dispense:  28 patch    Refill:  0    Immunization History  Administered Date(s) Administered  . Influenza Whole 05/11/2006    Family History  Problem Relation Age of Onset  . Diabetes Mother   . Depression Mother   . Hypertension Mother   . Stroke Paternal Grandmother   . Stroke Paternal Grandfather     History  Substance Use Topics  . Smoking status: Current Every Day Smoker  . Smokeless tobacco: Not on file  . Alcohol Use: Yes     Comment: occasional    Review of Systems   As noted in HPI  Filed Vitals:   01/15/14 1125  BP: 117/85  Pulse: 61  Temp: 98.8 F (37.1 C)  Resp:  16    Physical Exam  Physical Exam  Constitutional: No distress.  Eyes: EOM are normal. Pupils are equal, round, and reactive to light.  Cardiovascular: Normal rate and regular rhythm.   Pulmonary/Chest: Breath sounds normal. No respiratory distress. She has no wheezes. She has no rales.  Musculoskeletal: She exhibits no edema.  Psychiatric:  Anxious tearfull    CBC    Component Value Date/Time   WBC 8.1 09/04/2013 0953   RBC 4.39 09/04/2013 0953   HGB 13.0 09/04/2013 0953   HCT 39.0 09/04/2013 0953   PLT 295 09/04/2013 0953   MCV 88.8 09/04/2013 0953   LYMPHSABS 2.7 09/04/2013 0953   MONOABS 0.5 09/04/2013 0953   EOSABS 0.6 09/04/2013 0953   BASOSABS 0.0 09/04/2013 0953    CMP     Component Value Date/Time   NA 140 09/04/2013 0953   K 4.1 09/04/2013 0953   CL 104 09/04/2013 0953   CO2 26 09/04/2013 0953   GLUCOSE 109* 09/04/2013 0953   BUN 14 09/04/2013 0953   CREATININE 0.68 09/04/2013 0953   CREATININE 0.68 08/02/2013 0550   CALCIUM 9.7 09/04/2013 0953   PROT 6.2 09/04/2013 0953   ALBUMIN 4.1 09/04/2013 0953   AST 15 09/04/2013 0953   ALT 14 09/04/2013 0953   ALKPHOS 52 09/04/2013 0953   BILITOT 1.1 09/04/2013 0953   GFRNONAA >90 08/02/2013 0550   GFRAA >90 08/02/2013 0550    Lab Results  Component Value Date/Time   CHOL 160 09/04/2013  9:53 AM    No components found with this basename: hga1c    Lab Results  Component Value Date/Time   AST 15 09/04/2013  9:53 AM    Assessment and Plan  Diabetes mellitus due to underlying condition without complications - Plan: Results for orders placed in visit on 01/15/14  GLUCOSE, POCT (MANUAL RESULT ENTRY)      Result Value Ref Range   POC Glucose 104 (*) 70 - 99 mg/dl  POCT GLYCOSYLATED HEMOGLOBIN (HGB A1C)      Result Value Ref Range   Hemoglobin A1C 5.4     I have reduced the dose of metFORMIN (GLUCOPHAGE) 500 MG tablet to one pill a day  Essential hypertension - Plan: Blood pressure is well controlled continue  with  lisinopril-hydrochlorothiazide (PRINZIDE,ZESTORETIC) 20-25 MG per tablet  ANXIETY - Plan: hydrOXYzine (VISTARIL) 25 MG capsule, venlafaxine XR (EFFEXOR-XR) 75 MG 24 hr capsule, [name] will also follow with our social worker  Smoking - Plan: nicotine (NICODERM CQ) 21 mg/24hr patch     Return in about 3 months (around 04/17/2014) for hypertension, anxiety.  Lorayne Marek, MD

## 2014-01-15 NOTE — Progress Notes (Signed)
Patient here for follow up on her DM States currently going through Benzo withdraw Was taking Klonopin for the past seventeen years Complains of back pain in several areas

## 2014-01-15 NOTE — Progress Notes (Signed)
LCSW met with patient in order to provide emotional support. Patient presented as very emotional prior to visit with provider.  LCSW calmed patient by providing some  Perspective and reframing about her current medical situation.  LCSW encouraged patient to identify key symptoms that she was seeking treatment for in order to get the necessary medical care that she needs.  Patient stated that for years she has dealt with anxiety and feels that she is currently dealing with the effects of benzo withdrawals. LCSW will follow up with patient in order to help navigate patient needs and provide support with social and mental health needs.  Patient states that she currently sees a counselor but is not interested in medication at this time.    Christene Lye MSW, LCSW

## 2014-01-15 NOTE — Patient Instructions (Signed)
Smoking Cessation Quitting smoking is important to your health and has many advantages. However, it is not always easy to quit since nicotine is a very addictive drug. Oftentimes, people try 3 times or more before being able to quit. This document explains the best ways for you to prepare to quit smoking. Quitting takes hard work and a lot of effort, but you can do it. ADVANTAGES OF QUITTING SMOKING  You will live longer, feel better, and live better.  Your body will feel the impact of quitting smoking almost immediately.  Within 20 minutes, blood pressure decreases. Your pulse returns to its normal level.  After 8 hours, carbon monoxide levels in the blood return to normal. Your oxygen level increases.  After 24 hours, the chance of having a heart attack starts to decrease. Your breath, hair, and body stop smelling like smoke.  After 48 hours, damaged nerve endings begin to recover. Your sense of taste and smell improve.  After 72 hours, the body is virtually free of nicotine. Your bronchial tubes relax and breathing becomes easier.  After 2 to 12 weeks, lungs can hold more air. Exercise becomes easier and circulation improves.  The risk of having a heart attack, stroke, cancer, or lung disease is greatly reduced.  After 1 year, the risk of coronary heart disease is cut in half.  After 5 years, the risk of stroke falls to the same as a nonsmoker.  After 10 years, the risk of lung cancer is cut in half and the risk of other cancers decreases significantly.  After 15 years, the risk of coronary heart disease drops, usually to the level of a nonsmoker.  If you are pregnant, quitting smoking will improve your chances of having a healthy baby.  The people you live with, especially any children, will be healthier.  You will have extra money to spend on things other than cigarettes. QUESTIONS TO THINK ABOUT BEFORE ATTEMPTING TO QUIT You may want to talk about your answers with your  health care provider.  Why do you want to quit?  If you tried to quit in the past, what helped and what did not?  What will be the most difficult situations for you after you quit? How will you plan to handle them?  Who can help you through the tough times? Your family? Friends? A health care provider?  What pleasures do you get from smoking? What ways can you still get pleasure if you quit? Here are some questions to ask your health care provider:  How can you help me to be successful at quitting?  What medicine do you think would be best for me and how should I take it?  What should I do if I need more help?  What is smoking withdrawal like? How can I get information on withdrawal? GET READY  Set a quit date.  Change your environment by getting rid of all cigarettes, ashtrays, matches, and lighters in your home, car, or work. Do not let people smoke in your home.  Review your past attempts to quit. Think about what worked and what did not. GET SUPPORT AND ENCOURAGEMENT You have a better chance of being successful if you have help. You can get support in many ways.  Tell your family, friends, and coworkers that you are going to quit and need their support. Ask them not to smoke around you.  Get individual, group, or telephone counseling and support. Programs are available at local hospitals and health centers. Call   your local health department for information about programs in your area.  Spiritual beliefs and practices may help some smokers quit.  Download a "quit meter" on your computer to keep track of quit statistics, such as how long you have gone without smoking, cigarettes not smoked, and money saved.  Get a self-help book about quitting smoking and staying off tobacco. LEARN NEW SKILLS AND BEHAVIORS  Distract yourself from urges to smoke. Talk to someone, go for a walk, or occupy your time with a task.  Change your normal routine. Take a different route to work.  Drink tea instead of coffee. Eat breakfast in a different place.  Reduce your stress. Take a hot bath, exercise, or read a book.  Plan something enjoyable to do every day. Reward yourself for not smoking.  Explore interactive web-based programs that specialize in helping you quit. GET MEDICINE AND USE IT CORRECTLY Medicines can help you stop smoking and decrease the urge to smoke. Combining medicine with the above behavioral methods and support can greatly increase your chances of successfully quitting smoking.  Nicotine replacement therapy helps deliver nicotine to your body without the negative effects and risks of smoking. Nicotine replacement therapy includes nicotine gum, lozenges, inhalers, nasal sprays, and skin patches. Some may be available over-the-counter and others require a prescription.  Antidepressant medicine helps people abstain from smoking, but how this works is unknown. This medicine is available by prescription.  Nicotinic receptor partial agonist medicine simulates the effect of nicotine in your brain. This medicine is available by prescription. Ask your health care provider for advice about which medicines to use and how to use them based on your health history. Your health care provider will tell you what side effects to look out for if you choose to be on a medicine or therapy. Carefully read the information on the package. Do not use any other product containing nicotine while using a nicotine replacement product.  RELAPSE OR DIFFICULT SITUATIONS Most relapses occur within the first 3 months after quitting. Do not be discouraged if you start smoking again. Remember, most people try several times before finally quitting. You may have symptoms of withdrawal because your body is used to nicotine. You may crave cigarettes, be irritable, feel very hungry, cough often, get headaches, or have difficulty concentrating. The withdrawal symptoms are only temporary. They are strongest  when you first quit, but they will go away within 10-14 days. To reduce the chances of relapse, try to:  Avoid drinking alcohol. Drinking lowers your chances of successfully quitting.  Reduce the amount of caffeine you consume. Once you quit smoking, the amount of caffeine in your body increases and can give you symptoms, such as a rapid heartbeat, sweating, and anxiety.  Avoid smokers because they can make you want to smoke.  Do not let weight gain distract you. Many smokers will gain weight when they quit, usually less than 10 pounds. Eat a healthy diet and stay active. You can always lose the weight gained after you quit.  Find ways to improve your mood other than smoking. FOR MORE INFORMATION  www.smokefree.gov  Document Released: 06/06/2001 Document Revised: 10/27/2013 Document Reviewed: 09/21/2011 ExitCare Patient Information 2015 [employer]. This information is not intended to replace advice given to you by your health care provider. Make sure you discuss any questions you have with your health care provider. DASH Eating Plan DASH stands for "Dietary Approaches to Stop Hypertension." The DASH eating plan is a healthy eating plan that has   been shown to reduce high blood pressure (hypertension). Additional health benefits may include reducing the risk of type 2 diabetes mellitus, heart disease, and stroke. The DASH eating plan may also help with weight loss. WHAT DO I NEED TO KNOW ABOUT THE DASH EATING PLAN? For the DASH eating plan, you will follow these general guidelines:  Choose foods with a percent daily value for sodium of less than 5% (as listed on the food label).  Use salt-free seasonings or herbs instead of table salt or sea salt.  Check with your health care provider or pharmacist before using salt substitutes.  Eat lower-sodium products, often labeled as "lower sodium" or "no salt added."  Eat fresh foods.  Eat more vegetables, fruits, and low-fat dairy  products.  Choose whole grains. Look for the word "whole" as the first word in the ingredient list.  Choose fish and skinless chicken or turkey more often than red meat. Limit fish, poultry, and meat to 6 oz (170 g) each day.  Limit sweets, desserts, sugars, and sugary drinks.  Choose heart-healthy fats.  Limit cheese to 1 oz (28 g) per day.  Eat more home-cooked food and less restaurant, buffet, and fast food.  Limit fried foods.  Cook foods using methods other than frying.  Limit canned vegetables. If you do use them, rinse them well to decrease the sodium.  When eating at a restaurant, ask that your food be prepared with less salt, or no salt if possible. WHAT FOODS CAN I EAT? Seek help from a dietitian for individual calorie needs. Grains Whole grain or whole wheat bread. Brown rice. Whole grain or whole wheat pasta. Quinoa, bulgur, and whole grain cereals. Low-sodium cereals. Corn or whole wheat flour tortillas. Whole grain cornbread. Whole grain crackers. Low-sodium crackers. Vegetables Fresh or frozen vegetables (raw, steamed, roasted, or grilled). Low-sodium or reduced-sodium tomato and vegetable juices. Low-sodium or reduced-sodium tomato sauce and paste. Low-sodium or reduced-sodium canned vegetables.  Fruits All fresh, canned (in natural juice), or frozen fruits. Meat and Other Protein Products Ground beef (85% or leaner), grass-fed beef, or beef trimmed of fat. Skinless chicken or turkey. Ground chicken or turkey. Pork trimmed of fat. All fish and seafood. Eggs. Dried beans, peas, or lentils. Unsalted nuts and seeds. Unsalted canned beans. Dairy Low-fat dairy products, such as skim or 1% milk, 2% or reduced-fat cheeses, low-fat ricotta or cottage cheese, or plain low-fat yogurt. Low-sodium or reduced-sodium cheeses. Fats and Oils Tub margarines without trans fats. Light or reduced-fat mayonnaise and salad dressings (reduced sodium). Avocado. Safflower, olive, or canola  oils. Natural peanut or almond butter. Other Unsalted popcorn and pretzels. The items listed above may not be a complete list of recommended foods or beverages. Contact your dietitian for more options. WHAT FOODS ARE NOT RECOMMENDED? Grains White bread. White pasta. White rice. Refined cornbread. Bagels and croissants. Crackers that contain trans fat. Vegetables Creamed or fried vegetables. Vegetables in a cheese sauce. Regular canned vegetables. Regular canned tomato sauce and paste. Regular tomato and vegetable juices. Fruits Dried fruits. Canned fruit in light or heavy syrup. Fruit juice. Meat and Other Protein Products Fatty cuts of meat. Ribs, chicken wings, bacon, sausage, bologna, salami, chitterlings, fatback, hot dogs, bratwurst, and packaged luncheon meats. Salted nuts and seeds. Canned beans with salt. Dairy Whole or 2% milk, cream, half-and-half, and cream cheese. Whole-fat or sweetened yogurt. Full-fat cheeses or blue cheese. Nondairy creamers and whipped toppings. Processed cheese, cheese spreads, or cheese curds. Condiments Onion and garlic salt,   seasoned salt, table salt, and sea salt. Canned and packaged gravies. Worcestershire sauce. Tartar sauce. Barbecue sauce. Teriyaki sauce. Soy sauce, including reduced sodium. Steak sauce. Fish sauce. Oyster sauce. Cocktail sauce. Horseradish. Ketchup and mustard. Meat flavorings and tenderizers. Bouillon cubes. Hot sauce. Tabasco sauce. Marinades. Taco seasonings. Relishes. Fats and Oils Butter, stick margarine, lard, shortening, ghee, and bacon fat. Coconut, palm kernel, or palm oils. Regular salad dressings. Other Pickles and olives. Salted popcorn and pretzels. The items listed above may not be a complete list of foods and beverages to avoid. Contact your dietitian for more information. WHERE CAN I FIND MORE INFORMATION? National Heart, Lung, and Blood Institute: www.nhlbi.nih.gov/health/health-topics/topics/dash/ Document Released:  06/01/2011 Document Revised: 10/27/2013 Document Reviewed: 04/16/2013 ExitCare Patient Information 2015 [employer]. This information is not intended to replace advice given to you by your health care provider. Make sure you discuss any questions you have with your health care provider.  

## 2014-02-10 ENCOUNTER — Ambulatory Visit: Payer: Self-pay

## 2014-02-25 ENCOUNTER — Other Ambulatory Visit: Payer: Self-pay | Admitting: Internal Medicine

## 2014-03-23 ENCOUNTER — Other Ambulatory Visit: Payer: Self-pay | Admitting: Internal Medicine

## 2014-05-29 ENCOUNTER — Other Ambulatory Visit: Payer: Self-pay | Admitting: Internal Medicine

## 2014-06-02 ENCOUNTER — Other Ambulatory Visit: Payer: Self-pay

## 2014-06-02 DIAGNOSIS — F411 Generalized anxiety disorder: Secondary | ICD-10-CM

## 2014-06-02 MED ORDER — PERPHENAZINE 8 MG PO TABS: 8.0000 mg | ORAL_TABLET | Freq: Every day | ORAL | Status: DC

## 2014-06-02 NOTE — Progress Notes (Unsigned)
Patient here for refill on her trilafon Per Dr Annitta Needs we will refill for one month Needs a follow up appointment and referral placed for psych

## 2014-06-15 ENCOUNTER — Telehealth: Payer: Self-pay | Admitting: Internal Medicine

## 2014-06-15 NOTE — Telephone Encounter (Signed)
Patient has come in today to see if she can switch providers; patient prefers a female provider; please f/u with patient about this request

## 2014-06-15 NOTE — Telephone Encounter (Signed)
If patient prefers to see the female provider, its [place] patient can switch

## 2014-06-15 NOTE — Telephone Encounter (Signed)
Patient has requested a provider change; please f/u with patient and PCP about this switch; patient would like a female provider instead

## 2014-06-22 ENCOUNTER — Ambulatory Visit: Payer: Self-pay | Attending: Internal Medicine | Admitting: Internal Medicine

## 2014-06-22 ENCOUNTER — Ambulatory Visit (HOSPITAL_BASED_OUTPATIENT_CLINIC_OR_DEPARTMENT_OTHER): Payer: Self-pay | Admitting: *Deleted

## 2014-06-22 ENCOUNTER — Encounter: Payer: Self-pay | Admitting: Internal Medicine

## 2014-06-22 VITALS — BP 116/79 | HR 88 | Temp 98.0°F | Resp 16 | Ht 69.0 in | Wt 292.0 lb

## 2014-06-22 DIAGNOSIS — Z23 Encounter for immunization: Secondary | ICD-10-CM

## 2014-06-22 DIAGNOSIS — Z79899 Other long term (current) drug therapy: Secondary | ICD-10-CM | POA: Insufficient documentation

## 2014-06-22 DIAGNOSIS — I10 Essential (primary) hypertension: Secondary | ICD-10-CM | POA: Insufficient documentation

## 2014-06-22 DIAGNOSIS — E119 Type 2 diabetes mellitus without complications: Secondary | ICD-10-CM | POA: Insufficient documentation

## 2014-06-22 DIAGNOSIS — F329 Major depressive disorder, single episode, unspecified: Secondary | ICD-10-CM | POA: Insufficient documentation

## 2014-06-22 DIAGNOSIS — E663 Overweight: Secondary | ICD-10-CM | POA: Insufficient documentation

## 2014-06-22 LAB — POCT GLYCOSYLATED HEMOGLOBIN (HGB A1C): HEMOGLOBIN A1C: 5.5

## 2014-06-22 LAB — GLUCOSE, POCT (MANUAL RESULT ENTRY): POC Glucose: 123 mg/dl — AB (ref 70–99)

## 2014-06-22 MED ORDER — METFORMIN HCL 500 MG PO TABS: 500.0000 mg | ORAL_TABLET | Freq: Every day | ORAL | Status: DC

## 2014-06-22 MED ORDER — PERPHENAZINE 8 MG PO TABS: 8.0000 mg | ORAL_TABLET | Freq: Every day | ORAL | Status: DC

## 2014-06-22 NOTE — Progress Notes (Signed)
Patient ID: [name], female   DOB: [birth year], 48 y.o.   MRN: [id number]   SUBJECTIVE: 48 y.o. female for follow up of diabetes. Diabetic Review of Systems - medication compliance: compliant all of the time, diabetic diet compliance: compliant all of the time, further diabetic ROS: no polyuria or polydipsia, no chest pain, dyspnea or TIA's, no numbness, tingling or pain in extremities.  Other symptoms and concerns: She wants a refill of the Trilafon, was taken off medication in past and ended up in [place]. Reports that she did not like the [facility] [place].  She states that she was on Klonopin in the past when she was addicted.  She does not desire to be back on Klonopin.  She has a Social worker through a Biomedical engineer who is a Company secretary.    Current Outpatient Prescriptions  Medication Sig Dispense Refill  . etonogestrel (NEXPLANON) 68 MG IMPL implant Inject 1 each into the skin once.    . hydrOXYzine (VISTARIL) 25 MG capsule Take 1 capsule (25 mg total) by mouth 3 (three) times daily as needed. Take one capsule in the morning, and one at lunch and 2 at bedtime as needed 90 capsule 3  . lisinopril-hydrochlorothiazide (PRINZIDE,ZESTORETIC) 20-25 MG per tablet Take 1 tablet by mouth daily. 30 tablet 5  . metFORMIN (GLUCOPHAGE) 500 MG tablet Take 1 tablet (500 mg total) by mouth daily with breakfast. 30 tablet 5  . perphenazine (TRILAFON) 8 MG tablet Take 1 tablet (8 mg total) by mouth at bedtime. 30 tablet 0  . venlafaxine XR (EFFEXOR-XR) 75 MG 24 hr capsule Take 3 capsules (225 mg total) by mouth daily with breakfast. 90 capsule 3  . cholecalciferol (VITAMIN D) 1000 UNITS tablet Take 1,000 Units by mouth daily.    . fluticasone (FLONASE) 50 MCG/ACT nasal spray Place 1 spray into both nostrils daily as needed for allergies or rhinitis. (Patient not taking: Reported on 06/22/2014) 16 g 5  . nicotine (NICODERM CQ) 21 mg/24hr patch Place 1 patch (21 mg total) onto  the skin daily. (Patient not taking: Reported on 06/22/2014) 28 patch 0   No current facility-administered medications for this visit.    OBJECTIVE: Appearance: alert, well appearing, and in no distress, oriented to person, place, and time, overweight, crying and aggressive and upset about medication refill of Trilafon. BP 116/79 mmHg  Pulse 88  Temp(Src) 98 F (36.7 C) (Oral)  Resp 16  Ht 5\' 9"  (1.753 m)  Wt 292 lb (132.45 kg)  BMI 43.10 kg/m2  SpO2 98%  Exam: heart sounds normal rate, regular rhythm, normal S1, S2, no murmurs, rubs, clicks or gallops, chest clear  ASSESSMENT: Diabetes Mellitus: well controlled  PLAN: See orders for this visit as documented in the electronic medical record. Issues reviewed with her: patient did not want to discuss diabetes she only wanted to know about psych medication refills.  Shauntea was seen today for follow-up.  Diagnoses and associated orders for this visit:  Type 2 diabetes mellitus without complication - Glucose (CBG) - HgB A1c - metFORMIN (GLUCOPHAGE) 500 MG tablet; Take 1 tablet (500 mg total) by mouth daily with breakfast.  MDD - perphenazine (TRILAFON) 8 MG tablet; Take 1 tablet (8 mg total) by mouth at bedtime.--Explained to patient in extensive detail that [facility] is unable to operate as her psychiatrist/mental health. She will only receive a one-month supply no other refills will be written from this office. Patient given resources to  [facility] as well as self-pay practices. Dr. Doreene Burke consulted and is in agreement with plan of care. 20 minutes spent counseling patient on need for psychiatry  Return in about 3 months (around 09/21/2014) for DM/HTN.   Chari Manning, NP 06/23/2014 11:35 AM

## 2014-06-22 NOTE — Progress Notes (Signed)
Pt is here following up on her diabetes. Pt is requesting a flu shot. Pt has questions about menopause. Pt states that she is not getting enough sleep.

## 2014-06-22 NOTE — Patient Instructions (Signed)
You will get a one month supply of medication. No other refills will be given from this office.  You have been given a list of resources by the referral coordinator, please seek psychiatry for additional needs.  If no one is available you may seek evaluation from the local hospital who have behavioral health specialist.

## 2014-06-24 NOTE — Telephone Encounter (Signed)
Noted. Patient saw [name] yesterday.

## 2014-07-07 ENCOUNTER — Ambulatory Visit: Payer: Self-pay

## 2014-07-13 ENCOUNTER — Encounter: Payer: Self-pay | Admitting: Internal Medicine

## 2014-07-13 ENCOUNTER — Ambulatory Visit: Payer: Self-pay | Attending: Internal Medicine | Admitting: Internal Medicine

## 2014-07-13 VITALS — BP 130/74 | HR 104 | Temp 98.7°F | Resp 18 | Ht 69.0 in | Wt 297.0 lb

## 2014-07-13 DIAGNOSIS — R300 Dysuria: Secondary | ICD-10-CM

## 2014-07-13 DIAGNOSIS — N3001 Acute cystitis with hematuria: Secondary | ICD-10-CM

## 2014-07-13 DIAGNOSIS — E089 Diabetes mellitus due to underlying condition without complications: Secondary | ICD-10-CM

## 2014-07-13 LAB — POCT URINALYSIS DIPSTICK
Bilirubin, UA: NEGATIVE
Glucose, UA: NEGATIVE
Ketones, UA: NEGATIVE
Nitrite, UA: NEGATIVE
PH UA: 6.5
Spec Grav, UA: 1.025
UROBILINOGEN UA: 0.2

## 2014-07-13 MED ORDER — CIPROFLOXACIN HCL 500 MG PO TABS: 500.0000 mg | ORAL_TABLET | Freq: Two times a day (BID) | ORAL | Status: DC

## 2014-07-13 NOTE — Patient Instructions (Signed)

## 2014-07-13 NOTE — Progress Notes (Signed)
Pt comes in with possible UTI Sx's dysuria started last Friday with frequency/urge Denies low back pain,fever,chills Pt has Nexplanon BC-  Urine dip obtained

## 2014-07-13 NOTE — Progress Notes (Signed)
Patient ID: [name], female   DOB: 1965/10/02, 49 y.o.   MRN: [id number]  CC: urinary symptoms  HPI: [name] is a 49 y.o. female here today for a follow up visit.  Patient has past medical history of T2DM, HTN, Depression, and PTSD. She reports she has been having urinary frequency and dysuria since Friday. She denies fevers, chills, or flank pain.  She has noticed some hematuria.    Patient has No headache, No chest pain, No abdominal pain - No Nausea, No new weakness tingling or numbness, No Cough - SOB.  Allergies  Allergen Reactions  . Latex Rash   Past Medical History  Diagnosis Date  . Diabetes mellitus   . Hypertension   . Depression   . Obesity   . PTSD (post-traumatic stress disorder)    Current Outpatient Prescriptions on File Prior to Visit  Medication Sig Dispense Refill  . etonogestrel (NEXPLANON) 68 MG IMPL implant Inject 1 each into the skin once.    . fluticasone (FLONASE) 50 MCG/ACT nasal spray Place 1 spray into both nostrils daily as needed for allergies or rhinitis. 16 g 5  . lisinopril-hydrochlorothiazide (PRINZIDE,ZESTORETIC) 20-25 MG per tablet Take 1 tablet by mouth daily. 30 tablet 5  . metFORMIN (GLUCOPHAGE) 500 MG tablet Take 1 tablet (500 mg total) by mouth daily with breakfast. 30 tablet 5  . venlafaxine XR (EFFEXOR-XR) 75 MG 24 hr capsule Take 3 capsules (225 mg total) by mouth daily with breakfast. 90 capsule 3  . cholecalciferol (VITAMIN D) 1000 UNITS tablet Take 1,000 Units by mouth daily.    . hydrOXYzine (VISTARIL) 25 MG capsule Take 1 capsule (25 mg total) by mouth 3 (three) times daily as needed. Take one capsule in the morning, and one at lunch and 2 at bedtime as needed (Patient not taking: Reported on 07/13/2014) 90 capsule 3  . nicotine (NICODERM CQ) 21 mg/24hr patch Place 1 patch (21 mg total) onto the skin daily. (Patient not taking: Reported on 06/22/2014) 28 patch 0  . perphenazine (TRILAFON) 8 MG tablet Take 1 tablet (8 mg  total) by mouth at bedtime. 30 tablet 0   No current facility-administered medications on file prior to visit.   Family History  Problem Relation Age of Onset  . Diabetes Mother   . Depression Mother   . Hypertension Mother   . Stroke Paternal Grandmother   . Stroke Paternal Grandfather    History   Social History  . Marital Status: Single    Spouse Name: N/A    Number of Children: N/A  . Years of Education: N/A   Occupational History  . Not on file.   Social History Main Topics  . Smoking status: Current Every Day Smoker  . Smokeless tobacco: Not on file  . Alcohol Use: Yes     Comment: occasional  . Drug Use: Yes    Special: Marijuana  . Sexual Activity: Not on file   Other Topics Concern  . Not on file   Social History Narrative    Review of Systems: See HPI   Objective:   Filed Vitals:   07/13/14 1138  BP: 130/74  Pulse: 104  Temp: 98.7 F (37.1 C)  Resp: 18    Physical Exam  Constitutional: She is oriented to person, place, and time.  Cardiovascular: Normal rate, regular rhythm and normal heart sounds.   Pulmonary/Chest: Effort normal and breath sounds normal.  Abdominal: Soft. Bowel sounds are normal. There is no tenderness.  Musculoskeletal: Normal range of motion. She exhibits no tenderness.  No CVA tenderness  Neurological: She is alert and oriented to person, place, and time.  Skin: Skin is warm and dry.  Vitals reviewed.  .  Lab Results  Component Value Date   WBC 8.1 09/04/2013   HGB 13.0 09/04/2013   HCT 39.0 09/04/2013   MCV 88.8 09/04/2013   PLT 295 09/04/2013   Lab Results  Component Value Date   CREATININE 0.68 09/04/2013   BUN 14 09/04/2013   NA 140 09/04/2013   K 4.1 09/04/2013   CL 104 09/04/2013   CO2 26 09/04/2013    Lab Results  Component Value Date   HGBA1C 5.5 06/22/2014   Lipid Panel     Component Value Date/Time   CHOL 160 09/04/2013 0953   TRIG 98 09/04/2013 0953   HDL 49 09/04/2013 0953   CHOLHDL  3.3 09/04/2013 0953   VLDL 20 09/04/2013 0953   LDLCALC 91 09/04/2013 0953       Assessment and plan:   Puja was seen today for follow-up and urinary tract infection.  Diagnoses and associated orders for this visit:  Dysuria - Urinalysis Dipstick  Acute cystitis with hematuria - ciprofloxacin (CIPRO) 500 MG tablet; Take 1 tablet (500 mg total) by mouth 2 (two) times daily. - Urine culture  Diabetes mellitus due to underlying condition without complications - Microalbumin, urine   Return if symptoms worsen or fail to improve.        Chari Manning, [facility] [phone] 07/13/2014, 11:53 AM

## 2014-07-14 LAB — MICROALBUMIN, URINE: Microalb, Ur: 9 mg/dL — ABNORMAL HIGH (ref <2.0)

## 2014-07-16 LAB — URINE CULTURE: Colony Count: >=100000

## 2014-07-21 ENCOUNTER — Telehealth: Payer: Self-pay | Admitting: *Deleted

## 2014-07-21 NOTE — Telephone Encounter (Signed)
Unable to contact Pt. Phone not accepting phone calls at this time

## 2014-07-21 NOTE — Telephone Encounter (Signed)
-----   Message from Lance Bosch, NP sent at 07/16/2014  9:18 AM EST ----- Patient culture came back and she is on the proper antibiotic. Find out if she has had relief of symptoms since beginning medication

## 2014-08-03 ENCOUNTER — Ambulatory Visit: Payer: Self-pay | Attending: Internal Medicine

## 2014-09-17 ENCOUNTER — Encounter (HOSPITAL_COMMUNITY): Payer: Self-pay | Admitting: Emergency Medicine

## 2014-09-17 ENCOUNTER — Emergency Department (INDEPENDENT_AMBULATORY_CARE_PROVIDER_SITE_OTHER)
Admission: EM | Admit: 2014-09-17 | Discharge: 2014-09-17 | Disposition: A | Discharge status: Home / Self Care | Payer: Self-pay | Source: Home / Self Care | Attending: Family Medicine | Admitting: Family Medicine

## 2014-09-17 DIAGNOSIS — E089 Diabetes mellitus due to underlying condition without complications: Secondary | ICD-10-CM

## 2014-09-17 DIAGNOSIS — E119 Type 2 diabetes mellitus without complications: Secondary | ICD-10-CM

## 2014-09-17 DIAGNOSIS — J441 Chronic obstructive pulmonary disease with (acute) exacerbation: Secondary | ICD-10-CM

## 2014-09-17 LAB — POCT I-STAT, CHEM 8
BUN: 10 mg/dL (ref 6–23)
Calcium, Ion: 1.16 mmol/L (ref 1.12–1.23)
Chloride: 98 mmol/L (ref 96–112)
Creatinine, Ser: 0.8 mg/dL (ref 0.50–1.10)
Glucose, Bld: 108 mg/dL — ABNORMAL HIGH (ref 70–99)
HCT: 42 % (ref 36.0–46.0)
Hemoglobin: 14.3 g/dL (ref 12.0–15.0)
Potassium: 3.8 mmol/L (ref 3.5–5.1)
Sodium: 136 mmol/L (ref 135–145)
TCO2: 24 mmol/L (ref 0–100)

## 2014-09-17 MED ORDER — METFORMIN HCL 500 MG PO TABS: 500.0000 mg | ORAL_TABLET | Freq: Two times a day (BID) | ORAL | Status: DC

## 2014-09-17 MED ORDER — ALBUTEROL SULFATE HFA 108 (90 BASE) MCG/ACT IN AERS
INHALATION_SPRAY | RESPIRATORY_TRACT | Status: AC
  Filled 2014-09-17: qty 6.7

## 2014-09-17 MED ORDER — IPRATROPIUM-ALBUTEROL 0.5-2.5 (3) MG/3ML IN SOLN
RESPIRATORY_TRACT | Status: AC
  Filled 2014-09-17: qty 3

## 2014-09-17 MED ORDER — ALBUTEROL SULFATE HFA 108 (90 BASE) MCG/ACT IN AERS
1.0000 | INHALATION_SPRAY | Freq: Once | RESPIRATORY_TRACT | Status: AC
  Administered 2014-09-17: 2 via RESPIRATORY_TRACT

## 2014-09-17 MED ORDER — IPRATROPIUM-ALBUTEROL 0.5-2.5 (3) MG/3ML IN SOLN
3.0000 mL | Freq: Once | RESPIRATORY_TRACT | Status: AC
  Administered 2014-09-17: 3 mL via RESPIRATORY_TRACT

## 2014-09-17 MED ORDER — AZITHROMYCIN 250 MG PO TABS: 250.0000 mg | ORAL_TABLET | Freq: Every day | ORAL | Status: DC

## 2014-09-17 MED ORDER — PREDNISONE 50 MG PO TABS: 50.0000 mg | ORAL_TABLET | Freq: Every day | ORAL | Status: DC

## 2014-09-17 NOTE — ED Provider Notes (Signed)
[name] is a 49 y.o. female who presents to Urgent Care today for cough. Patient has a 4 day history of productive cough and wheezing with shortness of breath. She denies any chest pain or palpitations. She is a current daily smoker with about a 30-pack-year history. She has tried some over-the-counter medicines which have not helped much.  Additionally patient notes that she has run out of her metformin. She does not check her blood sugar. No polyuria or polydipsia present.   Past Medical History  Diagnosis Date  . Diabetes mellitus   . Hypertension   . Depression   . Obesity   . PTSD (post-traumatic stress disorder)    Past Surgical History  Procedure Laterality Date  . Induced abortion     History  Substance Use Topics  . Smoking status: Current Every Day Smoker  . Smokeless tobacco: Not on file  . Alcohol Use: Yes     Comment: occasional   ROS as above Medications: Current Facility-Administered Medications  Medication Dose Route Frequency Provider Last Rate Last Dose  . albuterol (PROVENTIL HFA;VENTOLIN HFA) 108 (90 BASE) MCG/ACT inhaler 1-2 puff  1-2 puff Inhalation Once Gregor Hams, MD       Current Outpatient Prescriptions  Medication Sig Dispense Refill  . cholecalciferol (VITAMIN D) 1000 UNITS tablet Take 1,000 Units by mouth daily.    [name] lisinopril-hydrochlorothiazide (PRINZIDE,ZESTORETIC) 20-25 MG per tablet Take 1 tablet by mouth daily. 30 tablet 5  . perphenazine (TRILAFON) 8 MG tablet Take 1 tablet (8 mg total) by mouth at bedtime. 30 tablet 0  . venlafaxine XR (EFFEXOR-XR) 75 MG 24 hr capsule Take 3 capsules (225 mg total) by mouth daily with breakfast. 90 capsule 3  . azithromycin (ZITHROMAX) 250 MG tablet Take 1 tablet (250 mg total) by mouth daily. Take first 2 tablets together, then 1 every day until finished. 6 tablet 0  . etonogestrel (NEXPLANON) 68 MG IMPL implant Inject 1 each into the skin once.    . fluticasone (FLONASE) 50 MCG/ACT nasal spray  Place 1 spray into both nostrils daily as needed for allergies or rhinitis. 16 g 5  . metFORMIN (GLUCOPHAGE) 500 MG tablet Take 1 tablet (500 mg total) by mouth 2 (two) times daily with a meal. 60 tablet 0  . predniSONE (DELTASONE) 50 MG tablet Take 1 tablet (50 mg total) by mouth daily. 5 tablet 0   Allergies  Allergen Reactions  . Latex Rash     Exam:  BP 134/87 mmHg  Pulse 97  Temp(Src) 99.3 F (37.4 C) (Oral)  Resp 20  SpO2 96% Gen: Well NAD HEENT: EOMI,  MMM normal posterior pharynx and tympanic membranes Lungs: Normal work of breathing. Wheezing and prolonged expiratory phase bilaterally. Heart: RRR no MRG Abd: NABS, Soft. Nondistended, Nontender Exts: Brisk capillary refill, warm and well perfused.   Patient was given a 2.5/0.5 mg DuoNeb nebulizer treatment and felt much better  Results for orders placed or performed during the hospital encounter of 09/17/14 (from the past 24 hour(s))  I-STAT, chem 8     Status: Abnormal   Collection Time: 09/17/14  2:36 PM  Result Value Ref Range   Sodium 136 135 - 145 mmol/L   Potassium 3.8 3.5 - 5.1 mmol/L   Chloride 98 96 - 112 mmol/L   BUN 10 6 - 23 mg/dL   Creatinine, Ser 0.80 0.50 - 1.10 mg/dL   Glucose, Bld 108 (H) 70 - 99 mg/dL   Calcium, Ion 1.16  1.12 - 1.23 mmol/L   TCO2 24 0 - 100 mmol/L   Hemoglobin 14.3 12.0 - 15.0 g/dL   HCT 42.0 36.0 - 46.0 %   No results found.  Assessment and Plan: 49 y.o. female with  1) bronchitis versus COPD exacerbation. Encouraged patient to discontinue smoking. Treat with prednisone, albuterol, and azithromycin. Will dispense and albuterol inhaler today prior to discharge.  2) diabetes: Refill Metformin   follow-up with PCP  Discussed warning signs or symptoms. Please see discharge instructions. Patient expresses understanding.     Gregor Hams, MD 09/17/14 [phone]

## 2014-09-17 NOTE — ED Notes (Signed)
C/o  Productive cough with yellow sputum.  Sob.  Chest congestion.  Nausea.  Night sweats.  Sinus drainage.  Symptoms present x 4 days.  No relief with otc meds.  Pt also states that she has been without metformin x 1 wk, needs refill.

## 2014-09-17 NOTE — Discharge Instructions (Signed)
Thank you for coming in today. Call or go to the emergency room if you get worse, have trouble breathing, have chest pains, or palpitations.  Please quit smoking.  Use albuterol as needed.   Chronic Obstructive Pulmonary Disease Exacerbation Chronic obstructive pulmonary disease (COPD) is a common lung condition in which airflow from the lungs is limited. COPD is a general term that can be used to describe many different lung problems that limit airflow, including chronic bronchitis and emphysema. COPD exacerbations are episodes when breathing symptoms become much worse and require extra treatment. Without treatment, COPD exacerbations can be life threatening, and frequent COPD exacerbations can cause further damage to your lungs. CAUSES   Respiratory infections.   Exposure to smoke.   Exposure to air pollution, chemical fumes, or dust. Sometimes there is no apparent cause or trigger. RISK FACTORS  Smoking cigarettes.  Older age.  Frequent prior COPD exacerbations. SIGNS AND SYMPTOMS   Increased coughing.   Increased thick spit (sputum) production.   Increased wheezing.   Increased shortness of breath.   Rapid breathing.   Chest tightness. DIAGNOSIS  Your medical history, a physical exam, and tests will help your health care provider make a diagnosis. Tests may include:  A chest X-ray.  Basic lab tests.  Sputum testing.  An arterial blood gas test. TREATMENT  Depending on the severity of your COPD exacerbation, you may need to be admitted to a hospital for treatment. Some of the treatments commonly used to treat COPD exacerbations are:   Antibiotic medicines.   Bronchodilators. These are drugs that expand the air passages. They may be given with an inhaler or nebulizer. Spacer devices may be needed to help improve drug delivery.  Corticosteroid medicines.  Supplemental oxygen therapy.  HOME CARE INSTRUCTIONS   Do not smoke. Quitting smoking is very  important to prevent COPD from getting worse and exacerbations from happening as often.  Avoid exposure to all substances that irritate the airway, especially to tobacco smoke.   If you were prescribed an antibiotic medicine, finish it all even if you start to feel better.  Take all medicines as directed by your health care provider.It is important to use correct technique with inhaled medicines.  Drink enough fluids to keep your urine clear or pale yellow (unless you have a medical condition that requires fluid restriction).  Use a cool mist vaporizer. This makes it easier to clear your chest when you cough.   If you have a home nebulizer and oxygen, continue to use them as directed.   Maintain all necessary vaccinations to prevent infections.   Exercise regularly.   Eat a healthy diet.   Keep all follow-up appointments as directed by your health care provider. SEEK IMMEDIATE MEDICAL CARE IF:  You have worsening shortness of breath.   You have trouble talking.   You have severe chest pain.  You have blood in your sputum.  You have a fever.  You have weakness, vomit repeatedly, or faint.   You feel confused.   You continue to get worse. MAKE SURE YOU:   Understand these instructions.  Will watch your condition.  Will get help right away if you are not doing well or get worse. Document Released: 04/09/2007 Document Revised: 10/27/2013 Document Reviewed: 02/14/2013 [facility] Patient Information 2015 [place], [place]. This information is not intended to replace advice given to you by your health care provider. Make sure you discuss any questions you have with your health care provider.   Diabetes Mellitus  and Food It is important for you to manage your blood sugar (glucose) level. Your blood glucose level can be greatly affected by what you eat. Eating healthier foods in the appropriate amounts throughout the day at about the same time each day will help you  control your blood glucose level. It can also help slow or prevent worsening of your diabetes mellitus. Healthy eating may even help you improve the level of your blood pressure and reach or maintain a healthy weight.  HOW CAN FOOD AFFECT ME? Carbohydrates Carbohydrates affect your blood glucose level more than any other type of food. Your dietitian will help you determine how many carbohydrates to eat at each meal and teach you how to count carbohydrates. Counting carbohydrates is important to keep your blood glucose at a healthy level, especially if you are using insulin or taking certain medicines for diabetes mellitus. Alcohol Alcohol can cause sudden decreases in blood glucose (hypoglycemia), especially if you use insulin or take certain medicines for diabetes mellitus. Hypoglycemia can be a life-threatening condition. Symptoms of hypoglycemia (sleepiness, dizziness, and disorientation) are similar to symptoms of having too much alcohol.  If your health care provider has given you approval to drink alcohol, do so in moderation and use the following guidelines:  Women should not have more than one drink per day, and men should not have more than two drinks per day. One drink is equal to:  12 oz of beer.  5 oz of wine.  1 oz of hard liquor.  Do not drink on an empty stomach.  Keep yourself hydrated. Have water, diet soda, or unsweetened iced tea.  Regular soda, juice, and other mixers might contain a lot of carbohydrates and should be counted. WHAT FOODS ARE NOT RECOMMENDED? As you make food choices, it is important to remember that all foods are not the same. Some foods have fewer nutrients per serving than other foods, even though they might have the same number of calories or carbohydrates. It is difficult to get your body what it needs when you eat foods with fewer nutrients. Examples of foods that you should avoid that are high in calories and carbohydrates but low in nutrients  include:  Trans fats (most processed foods list trans fats on the Nutrition Facts label).  Regular soda.  Juice.  Candy.  Sweets, such as cake, pie, doughnuts, and cookies.  Fried foods. WHAT FOODS CAN I EAT? Have nutrient-rich foods, which will nourish your body and keep you healthy. The food you should eat also will depend on several factors, including:  The calories you need.  The medicines you take.  Your weight.  Your blood glucose level.  Your blood pressure level.  Your cholesterol level. You also should eat a variety of foods, including:  Protein, such as meat, poultry, fish, tofu, nuts, and seeds (lean animal proteins are best).  Fruits.  Vegetables.  Dairy products, such as milk, cheese, and yogurt (low fat is best).  Breads, grains, pasta, cereal, rice, and beans.  Fats such as olive oil, trans fat-free margarine, canola oil, avocado, and olives. DOES EVERYONE WITH DIABETES MELLITUS HAVE THE SAME MEAL PLAN? Because every person with diabetes mellitus is different, there is not one meal plan that works for everyone. It is very important that you meet with a dietitian who will help you create a meal plan that is just right for you. Document Released: 03/09/2005 Document Revised: 06/17/2013 Document Reviewed: 05/09/2013 [facility] Patient Information 2015 [place], [place]. This information  is not intended to replace advice given to you by your health care provider. Make sure you discuss any questions you have with your health care provider.

## 2014-10-15 ENCOUNTER — Other Ambulatory Visit: Payer: Self-pay | Admitting: Internal Medicine

## 2014-10-27 ENCOUNTER — Other Ambulatory Visit: Payer: Self-pay | Admitting: *Deleted

## 2014-10-27 DIAGNOSIS — E119 Type 2 diabetes mellitus without complications: Secondary | ICD-10-CM

## 2014-10-27 MED ORDER — METFORMIN HCL 500 MG PO TABS: 500.0000 mg | ORAL_TABLET | Freq: Two times a day (BID) | ORAL | Status: DC

## 2015-05-27 ENCOUNTER — Other Ambulatory Visit: Payer: Self-pay | Admitting: Internal Medicine

## 2015-05-28 ENCOUNTER — Other Ambulatory Visit: Payer: Self-pay | Admitting: Internal Medicine

## 2015-05-28 DIAGNOSIS — E089 Diabetes mellitus due to underlying condition without complications: Secondary | ICD-10-CM

## 2015-05-31 NOTE — Telephone Encounter (Signed)
Nurse called patient on home number, reached voicemail. Left message for patient to call [name] with [facility], at [phone]. Mobile number is not a working number per recording. Nurse called patient to make patient aware of need for appointment. Nurse will refill metformin for 1 month supply only. No additional refills until seen by provider.

## 2015-06-07 ENCOUNTER — Other Ambulatory Visit: Payer: Self-pay | Admitting: Internal Medicine

## 2015-06-07 DIAGNOSIS — I10 Essential (primary) hypertension: Secondary | ICD-10-CM

## 2015-06-07 MED ORDER — LISINOPRIL-HYDROCHLOROTHIAZIDE 20-25 MG PO TABS: 1.0000 | ORAL_TABLET | Freq: Every day | ORAL | Status: DC

## 2015-06-07 NOTE — Telephone Encounter (Signed)
Nurse called patient, reached voicemail. Left message for patient to call Seylah Wernert with [facility], at [phone]. Nurse called to make patient aware of lisinopril-hctz, 1 month supply, sent to [facility] in [place].  Patient needs appointment to follow up on BP and DM before getting additional refills.

## 2015-06-07 NOTE — Telephone Encounter (Signed)
Pt needs a refill on lisinopril sent to [facility]. Pt explains that she has been out of this medication for five days. Please respond at the earliest convenience. Thank you, Fonda Kinder, ASA

## 2015-06-29 MED FILL — PERPHENAZINE 4 MG TABLET: 4 | 30 days supply | Qty: 60 | Fill #2

## 2015-06-29 MED FILL — ?FLUOXETINE HCL 20MG TABLET: 20 | 30 days supply | Qty: 30 | Fill #2

## 2015-06-29 MED FILL — VENLAFAXINE HCL ER 75 MG CA: 75 | 30 days supply | Qty: 30 | Fill #2

## 2015-07-01 ENCOUNTER — Ambulatory Visit: Payer: Self-pay | Attending: Internal Medicine | Admitting: Internal Medicine

## 2015-07-01 ENCOUNTER — Encounter: Payer: Self-pay | Admitting: Internal Medicine

## 2015-07-01 VITALS — BP 132/82 | HR 94 | Temp 98.7°F | Resp 16 | Ht 69.0 in | Wt 298.0 lb

## 2015-07-01 DIAGNOSIS — J069 Acute upper respiratory infection, unspecified: Secondary | ICD-10-CM

## 2015-07-01 MED ORDER — AMOXICILLIN-POT CLAVULANATE 875-125 MG PO TABS: 1.0000 | ORAL_TABLET | Freq: Two times a day (BID) | ORAL | Status: DC

## 2015-07-01 MED ORDER — AMOXICILLIN-POT CLAVULANATE 875-125 MG PO TABS
1.0000 | ORAL_TABLET | Freq: Two times a day (BID) | ORAL | Status: DC
Start: 2015-07-01 — End: 2016-08-14

## 2015-07-01 MED FILL — AMOX-CLAV 875-125 MG TABLET: 875-125 | 10 days supply | Qty: 20 | Fill #0

## 2015-07-01 NOTE — Progress Notes (Signed)
Patient complains of cough congestion a lot of pressure under her eyes Tightness in her chest and thick mucous Patients symptoms started about four days ago Also had a small lump to her upper eyelid on her right eye Patient has nexplanon that was supposed be removed in October but currently  Has no insurance

## 2015-07-01 NOTE — Progress Notes (Signed)
Patient ID: [name], female   DOB: 10-Apr-1966, 50 y.o.   MRN: [id number]  CC: cold symptoms  HPI: [name] is a 50 y.o. female here today for a follow up visit.  Patient has past medical history of diabetes, HTN, Depression, and obesity. Patient reports that she has been having symptoms of cough, chest congestion/tightness, sore throat, rhinitis, yellow mucous discharge. She has tried vitamin C drops. She also notes facial pressure. These symptoms have been present for 4 days. She is concerned about a lump on her upper right eyelid. Lump is not painful or draining. Small bump has been present for a "few weeks". No redness of area or blurred vision.  Allergies  Allergen Reactions  . Latex Rash   Past Medical History  Diagnosis Date  . Diabetes mellitus   . Hypertension   . Depression   . Obesity   . PTSD (post-traumatic stress disorder)    Current Outpatient Prescriptions on File Prior to Visit  Medication Sig Dispense Refill  . azithromycin (ZITHROMAX) 250 MG tablet Take 1 tablet (250 mg total) by mouth daily. Take first 2 tablets together, then 1 every day until finished. 6 tablet 0  . cholecalciferol (VITAMIN D) 1000 UNITS tablet Take 1,000 Units by mouth daily.    [name] etonogestrel (NEXPLANON) 68 MG IMPL implant Inject 1 each into the skin once.    . fluticasone (FLONASE) 50 MCG/ACT nasal spray Place 1 spray into both nostrils daily as needed for allergies or rhinitis. 16 g 5  . lisinopril-hydrochlorothiazide (PRINZIDE,ZESTORETIC) 20-25 MG tablet Take 1 tablet by mouth daily. 30 tablet 0  . metFORMIN (GLUCOPHAGE) 500 MG tablet TAKE 1 TABLET (500 MG TOTAL) BY MOUTH 2 (TWO) TIMES DAILY WITH A MEAL. 60 tablet 0  . perphenazine (TRILAFON) 8 MG tablet Take 1 tablet (8 mg total) by mouth at bedtime. 30 tablet 0  . predniSONE (DELTASONE) 50 MG tablet Take 1 tablet (50 mg total) by mouth daily. 5 tablet 0  . venlafaxine XR (EFFEXOR-XR) 75 MG 24 hr capsule Take 3 capsules (225  mg total) by mouth daily with breakfast. 90 capsule 3   No current facility-administered medications on file prior to visit.   Family History  Problem Relation Age of Onset  . Diabetes Mother   . Depression Mother   . Hypertension Mother   . Stroke Paternal Grandmother   . Stroke Paternal Grandfather    Social History   Social History  . Marital Status: Single    Spouse Name: N/A  . Number of Children: N/A  . Years of Education: N/A   Occupational History  . Not on file.   Social History Main Topics  . Smoking status: Current Every Day Smoker  . Smokeless tobacco: Not on file  . Alcohol Use: Yes     Comment: occasional  . Drug Use: Yes    Special: Marijuana  . Sexual Activity: Not on file   Other Topics Concern  . Not on file   Social History Narrative    Review of Systems: Other than what is stated in HPI, all other systems are negative.   Objective:   Filed Vitals:   07/01/15 1126  BP: 132/82  Pulse: 94  Temp: 98.7 F (37.1 C)  Resp: 16    Physical Exam  Constitutional: She is oriented to person, place, and time.  HENT:  Redness of bilateral ear canals Hearing aids bilaterally in place  Eyes: Right eye exhibits no discharge. Left eye exhibits  no discharge.  Chalazion of right eyelid at hair follicle No erythema  Cardiovascular: Normal rate, regular rhythm and normal heart sounds.   Pulmonary/Chest: Effort normal and breath sounds normal.  Neurological: She is alert and oriented to person, place, and time.     Lab Results  Component Value Date   WBC 8.1 09/04/2013   HGB 14.3 09/17/2014   HCT 42.0 09/17/2014   MCV 88.8 09/04/2013   PLT 295 09/04/2013   Lab Results  Component Value Date   CREATININE 0.80 09/17/2014   BUN 10 09/17/2014   NA 136 09/17/2014   K 3.8 09/17/2014   CL 98 09/17/2014   CO2 26 09/04/2013    Lab Results  Component Value Date   HGBA1C 5.5 06/22/2014   Lipid Panel     Component Value Date/Time   CHOL 160  09/04/2013 0953   TRIG 98 09/04/2013 0953   HDL 49 09/04/2013 0953   CHOLHDL 3.3 09/04/2013 0953   VLDL 20 09/04/2013 0953   LDLCALC 91 09/04/2013 0953       Assessment and plan:   [name] was seen today for sinusitis.  Diagnoses and all orders for this visit:  URI (upper respiratory infection) -     Begin amoxicillin-clavulanate (AUGMENTIN) 875-125 MG tablet; Take 1 tablet by mouth 2 (two) times daily.   Return in about 1 week (around 07/08/2015) for Diabetes Mellitus and 2-3 weeks Nexplanon removal.       Lance Bosch, [facility] [phone] 07/01/2015, 11:47 AM

## 2015-07-07 ENCOUNTER — Ambulatory Visit: Payer: Self-pay | Attending: Internal Medicine | Admitting: Internal Medicine

## 2015-07-07 ENCOUNTER — Encounter: Payer: Self-pay | Admitting: Internal Medicine

## 2015-07-07 VITALS — BP 132/76 | HR 99 | Temp 98.0°F | Resp 16 | Ht 69.0 in | Wt 294.0 lb

## 2015-07-07 DIAGNOSIS — Z79899 Other long term (current) drug therapy: Secondary | ICD-10-CM | POA: Insufficient documentation

## 2015-07-07 DIAGNOSIS — I10 Essential (primary) hypertension: Secondary | ICD-10-CM | POA: Insufficient documentation

## 2015-07-07 DIAGNOSIS — B353 Tinea pedis: Secondary | ICD-10-CM | POA: Insufficient documentation

## 2015-07-07 DIAGNOSIS — Z7984 Long term (current) use of oral hypoglycemic drugs: Secondary | ICD-10-CM | POA: Insufficient documentation

## 2015-07-07 DIAGNOSIS — E119 Type 2 diabetes mellitus without complications: Secondary | ICD-10-CM | POA: Insufficient documentation

## 2015-07-07 LAB — BASIC METABOLIC PANEL
BUN: 13 mg/dL (ref 7–25)
CHLORIDE: 102 mmol/L (ref 98–110)
CO2: 26 mmol/L (ref 20–31)
Calcium: 9.4 mg/dL (ref 8.6–10.4)
Creat: 0.7 mg/dL (ref 0.50–1.05)
Glucose, Bld: 111 mg/dL — ABNORMAL HIGH (ref 65–99)
Potassium: 4.6 mmol/L (ref 3.5–5.3)
Sodium: 136 mmol/L (ref 135–146)

## 2015-07-07 LAB — POCT GLYCOSYLATED HEMOGLOBIN (HGB A1C): HEMOGLOBIN A1C: 5.8

## 2015-07-07 LAB — GLUCOSE, POCT (MANUAL RESULT ENTRY): POC Glucose: 122 mg/dl — AB (ref 70–99)

## 2015-07-07 MED ORDER — METFORMIN HCL 500 MG PO TABS: ORAL_TABLET | ORAL | Status: DC

## 2015-07-07 MED ORDER — TERBINAFINE 1 % EX GEL: CUTANEOUS | Status: DC

## 2015-07-07 MED ORDER — LISINOPRIL-HYDROCHLOROTHIAZIDE 20-25 MG PO TABS: 1.0000 | ORAL_TABLET | Freq: Every day | ORAL | Status: DC

## 2015-07-07 MED FILL — LISINOPRIL-HCTZ 20-25 MG TA: 20-25 | 30 days supply | Qty: 30 | Fill #0

## 2015-07-07 MED FILL — ?METFORMIN HCL 500MG TABLET: 500 | 30 days supply | Qty: 60 | Fill #0

## 2015-07-07 NOTE — Progress Notes (Signed)
Patient ID: [name], female   DOB: [birth year], 50 y.o.   MRN: [id number] SUBJECTIVE: 50 y.o. female for follow up of diabetes and hypertension. Diabetic Review of Systems - medication compliance: compliant all of the time, diabetic diet compliance: compliant all of the time, home glucose monitoring: is not performed, further diabetic ROS: no polyuria or polydipsia, no chest pain, dyspnea or TIA's, no numbness, tingling or pain in extremities, no unusual visual symptoms, no hypoglycemia.  No complications of blood pressure medication.  Current Outpatient Prescriptions  Medication Sig Dispense Refill  . lisinopril-hydrochlorothiazide (PRINZIDE,ZESTORETIC) 20-25 MG tablet Take 1 tablet by mouth daily. 30 tablet 5  . metFORMIN (GLUCOPHAGE) 500 MG tablet TAKE 1 TABLET (500 MG TOTAL) BY MOUTH 2 (TWO) TIMES DAILY WITH A MEAL. 60 tablet 5  . amoxicillin-clavulanate (AUGMENTIN) 875-125 MG tablet Take 1 tablet by mouth 2 (two) times daily. 20 tablet 0  . azithromycin (ZITHROMAX) 250 MG tablet Take 1 tablet (250 mg total) by mouth daily. Take first 2 tablets together, then 1 every day until finished. 6 tablet 0  . cholecalciferol (VITAMIN D) 1000 UNITS tablet Take 1,000 Units by mouth daily.    [name] etonogestrel (NEXPLANON) 68 MG IMPL implant Inject 1 each into the skin once.    . fluticasone (FLONASE) 50 MCG/ACT nasal spray Place 1 spray into both nostrils daily as needed for allergies or rhinitis. 16 g 5  . perphenazine (TRILAFON) 8 MG tablet Take 1 tablet (8 mg total) by mouth at bedtime. 30 tablet 0  . predniSONE (DELTASONE) 50 MG tablet Take 1 tablet (50 mg total) by mouth daily. 5 tablet 0  . Terbinafine (LAMISIL ADVANCED) 1 % GEL May apply twice daily 12 g 1  . venlafaxine XR (EFFEXOR-XR) 75 MG 24 hr capsule Take 3 capsules (225 mg total) by mouth daily with breakfast. 90 capsule 3   No current facility-administered medications for this visit.  Review of Systems: Other than what is stated in  HPI, all other systems are negative.   OBJECTIVE: Appearance: alert, well appearing, and in no distress, oriented to person, place, and time and overweight. BP 132/76 mmHg  Pulse 99  Temp(Src) 98 F (36.7 C)  Resp 16  Ht 5\' 9"  (1.753 m)  Wt 294 lb (133.358 kg)  BMI 43.40 kg/m2  SpO2 100%  Exam: heart sounds normal rate, regular rhythm, normal S1, S2, no murmurs, rubs, clicks or gallops, no JVD, chest clear, no carotid bruits, feet: warm, good capillary refill, no trophic changes or ulcerative lesions, normal DP and PT pulses, normal monofilament exam and tinea pedis  ASSESSMENT: [name] was seen today for follow-up.  Diagnoses and all orders for this visit:  Type 2 diabetes mellitus without complication, without long-term current use of insulin (HCC) -     Glucose (CBG) -     HgB A1c -     Microalbumin, urine -     metFORMIN (GLUCOPHAGE) 500 MG tablet; TAKE 1 TABLET (500 MG TOTAL) BY MOUTH 2 (TWO) TIMES DAILY WITH A MEAL. Patients diabetes is well control as evidence by consistently low a1c.  Patient will continue with current therapy and continue to make necessary lifestyle changes.  Reviewed foot care, diet, exercise, annual health maintenance with patient.  Weight loss strongly encouraged  Essential hypertension -     lisinopril-hydrochlorothiazide (PRINZIDE,ZESTORETIC) 20-25 MG tablet; Take 1 tablet by mouth daily. -     Basic Metabolic Panel Patient blood pressure is stable and may continue on current medication.  Education on diet, exercise, and modifiable risk factors discussed. Will obtain appropriate labs as needed. Will follow up in 3-6 months.   Tinea pedis of left foot -     Terbinafine (LAMISIL ADVANCED) 1 % GEL; May apply twice daily    Return for 2 weeks Nexplanon removal and 6 mo PCP DM/HTN.   Lance Bosch, NP 07/07/2015 3:12 PM

## 2015-07-07 NOTE — Progress Notes (Signed)
Patient here for follow up on her diabetes and HTN Patient also requests refills on her medications

## 2015-07-08 ENCOUNTER — Ambulatory Visit: Payer: Self-pay

## 2015-07-08 LAB — MICROALBUMIN, URINE: Microalb, Ur: 0.4 mg/dL

## 2015-07-12 ENCOUNTER — Telehealth: Payer: Self-pay

## 2015-07-12 NOTE — Telephone Encounter (Signed)
-----   Message from Lance Bosch, NP sent at 07/09/2015  4:14 PM EST ----- Labs are within normal limits

## 2015-07-12 NOTE — Telephone Encounter (Signed)
Tried to contact patient this am  Patient not available Unable to leave message No voice mail on house number

## 2015-07-14 ENCOUNTER — Ambulatory Visit: Payer: Self-pay | Attending: Internal Medicine

## 2015-07-16 ENCOUNTER — Telehealth: Payer: Self-pay | Admitting: Internal Medicine

## 2015-07-16 MED FILL — VENLAFAXINE HCL ER 75 MG CA: 75 | 30 days supply | Qty: 60 | Fill #0

## 2015-07-16 MED FILL — PERPHENAZINE 4 MG TABLET: 4 | 6 days supply | Qty: 12 | Fill #0

## 2015-07-21 MED FILL — PERPHENAZINE 4 MG TABLET: 4 | 30 days supply | Qty: 60 | Fill #0

## 2015-07-21 MED FILL — ?FLUOXETINE HCL 20MG TABLET: 20 | 30 days supply | Qty: 30 | Fill #0

## 2015-07-21 MED FILL — hydrOXYzine HCL 50 MG TABS: 50 | 30 days supply | Qty: 60 | Fill #0

## 2015-07-28 ENCOUNTER — Encounter: Payer: Self-pay | Admitting: Internal Medicine

## 2015-07-28 ENCOUNTER — Ambulatory Visit: Payer: Self-pay | Attending: Internal Medicine | Admitting: Internal Medicine

## 2015-07-28 VITALS — BP 115/80 | HR 100 | Temp 98.0°F | Resp 17 | Ht 69.0 in | Wt 294.0 lb

## 2015-07-28 DIAGNOSIS — Z7984 Long term (current) use of oral hypoglycemic drugs: Secondary | ICD-10-CM | POA: Insufficient documentation

## 2015-07-28 DIAGNOSIS — F17200 Nicotine dependence, unspecified, uncomplicated: Secondary | ICD-10-CM | POA: Insufficient documentation

## 2015-07-28 DIAGNOSIS — Z888 Allergy status to other drugs, medicaments and biological substances status: Secondary | ICD-10-CM | POA: Insufficient documentation

## 2015-07-28 DIAGNOSIS — F4310 Post-traumatic stress disorder, unspecified: Secondary | ICD-10-CM | POA: Insufficient documentation

## 2015-07-28 DIAGNOSIS — F329 Major depressive disorder, single episode, unspecified: Secondary | ICD-10-CM | POA: Insufficient documentation

## 2015-07-28 DIAGNOSIS — E669 Obesity, unspecified: Secondary | ICD-10-CM | POA: Insufficient documentation

## 2015-07-28 DIAGNOSIS — E119 Type 2 diabetes mellitus without complications: Secondary | ICD-10-CM | POA: Insufficient documentation

## 2015-07-28 DIAGNOSIS — Z3046 Encounter for surveillance of implantable subdermal contraceptive: Secondary | ICD-10-CM

## 2015-07-28 DIAGNOSIS — I10 Essential (primary) hypertension: Secondary | ICD-10-CM | POA: Insufficient documentation

## 2015-07-28 DIAGNOSIS — Z79899 Other long term (current) drug therapy: Secondary | ICD-10-CM | POA: Insufficient documentation

## 2015-07-28 NOTE — Progress Notes (Signed)
Patient ID: [name], female   DOB: [birth year], 50 y.o.   MRN: [id number]  CC: nexplanon removal  HPI: [name] is a 50 y.o. female here today for a Nexplanon removal.  Patient has past medical history of diabetes, HTN, depression, PTSD, and obesity. Patient reports that she has had her Nexplanon in for over 3 years. She has not had any complications from medication. She does not desire birth control because she states that she is likely close to menopause.   Patient has No headache, No chest pain, No abdominal pain - No Nausea, No new weakness tingling or numbness, No Cough - SOB.  Allergies  Allergen Reactions  . Latex Rash   Past Medical History  Diagnosis Date  . Diabetes mellitus   . Hypertension   . Depression   . Obesity   . PTSD (post-traumatic stress disorder)    Current Outpatient Prescriptions on File Prior to Visit  Medication Sig Dispense Refill  . amoxicillin-clavulanate (AUGMENTIN) 875-125 MG tablet Take 1 tablet by mouth 2 (two) times daily. 20 tablet 0  . azithromycin (ZITHROMAX) 250 MG tablet Take 1 tablet (250 mg total) by mouth daily. Take first 2 tablets together, then 1 every day until finished. 6 tablet 0  . cholecalciferol (VITAMIN D) 1000 UNITS tablet Take 1,000 Units by mouth daily.    [name] etonogestrel (NEXPLANON) 68 MG IMPL implant Inject 1 each into the skin once.    . fluticasone (FLONASE) 50 MCG/ACT nasal spray Place 1 spray into both nostrils daily as needed for allergies or rhinitis. 16 g 5  . lisinopril-hydrochlorothiazide (PRINZIDE,ZESTORETIC) 20-25 MG tablet Take 1 tablet by mouth daily. 30 tablet 5  . metFORMIN (GLUCOPHAGE) 500 MG tablet TAKE 1 TABLET (500 MG TOTAL) BY MOUTH 2 (TWO) TIMES DAILY WITH A MEAL. 60 tablet 5  . perphenazine (TRILAFON) 8 MG tablet Take 1 tablet (8 mg total) by mouth at bedtime. 30 tablet 0  . predniSONE (DELTASONE) 50 MG tablet Take 1 tablet (50 mg total) by mouth daily. 5 tablet 0  . Terbinafine (LAMISIL  ADVANCED) 1 % GEL May apply twice daily 12 g 1  . venlafaxine XR (EFFEXOR-XR) 75 MG 24 hr capsule Take 3 capsules (225 mg total) by mouth daily with breakfast. 90 capsule 3   No current facility-administered medications on file prior to visit.   Family History  Problem Relation Age of Onset  . Diabetes Mother   . Depression Mother   . Hypertension Mother   . Stroke Paternal Grandmother   . Stroke Paternal Grandfather    Social History   Social History  . Marital Status: Single    Spouse Name: N/A  . Number of Children: N/A  . Years of Education: N/A   Occupational History  . Not on file.   Social History Main Topics  . Smoking status: Current Every Day Smoker  . Smokeless tobacco: Not on file  . Alcohol Use: Yes     Comment: occasional  . Drug Use: Yes    Special: Marijuana  . Sexual Activity: Not on file   Other Topics Concern  . Not on file   Social History Narrative    Review of Systems: Constitutional: Negative for fever, chills, diaphoresis, activity change, appetite change and fatigue. HENT: Negative for ear pain, nosebleeds, congestion, facial swelling, rhinorrhea, neck pain, neck stiffness and ear discharge.  Eyes: Negative for pain, discharge, redness, itching and visual disturbance. Respiratory: Negative for cough, choking, chest tightness, shortness of breath,  wheezing and stridor.  Cardiovascular: Negative for chest pain, palpitations and leg swelling. Gastrointestinal: Negative for abdominal distention. Genitourinary: Negative for dysuria, urgency, frequency, hematuria, flank pain, decreased urine volume, difficulty urinating and dyspareunia.  Musculoskeletal: Negative for back pain, joint swelling, arthralgias and gait problem. Neurological: Negative for dizziness, tremors, seizures, syncope, facial asymmetry, speech difficulty, weakness, light-headedness, numbness and headaches.  Hematological: Negative for adenopathy. Does not bruise/bleed  easily. Psychiatric/Behavioral: Negative for hallucinations, behavioral problems, confusion, dysphoric mood, decreased concentration and agitation.    Objective:   Filed Vitals:   07/28/15 1405  BP: 115/80  Pulse: 100  Temp: 98 F (36.7 C)  Resp: 17    Physical Exam  Skin: Skin is warm and dry.  Nexplanon felt under skin of left arm     Lab Results  Component Value Date   WBC 8.1 09/04/2013   HGB 14.3 09/17/2014   HCT 42.0 09/17/2014   MCV 88.8 09/04/2013   PLT 295 09/04/2013   Lab Results  Component Value Date   CREATININE 0.70 07/07/2015   BUN 13 07/07/2015   NA 136 07/07/2015   K 4.6 07/07/2015   CL 102 07/07/2015   CO2 26 07/07/2015    Lab Results  Component Value Date   HGBA1C 5.80 07/07/2015   Lipid Panel     Component Value Date/Time   CHOL 160 09/04/2013 0953   TRIG 98 09/04/2013 0953   HDL 49 09/04/2013 0953   CHOLHDL 3.3 09/04/2013 0953   VLDL 20 09/04/2013 0953   LDLCALC 91 09/04/2013 0953       Assessment and plan:   [name] was seen today for nexplanon removal.  Diagnoses and all orders for this visit:  Encounter for Nexplanon removal PROCEDURE NOTE: Explanon removal Patient given informed consent, signed copy in the chart.  Appropriate time out taken  The patient's  left  arm was prepped and draped in the usual sterile fashion..  Local anaesthesia obtained using 2 cc of 2% lidocaine with epinephrine.  Less than 3 cc blood loss. Removal site closed with dermabond and a pressure bandage to minimize bruising. There were no complications and the patient tolerated the procedure well.   Patient reports that she lives in a different city and has transportation issues so she cannot return in 3 days for a site recheck. I have went over things to watch for and to call if needed.  Return if symptoms worsen or fail to improve.       Lance Bosch, [facility] [phone] 07/28/2015, 5:59 PM

## 2015-07-28 NOTE — Patient Instructions (Signed)
Monitor for any signs of infection over next 3 days---redness, warmth, swelling  Leave bandage in place for 24 hours and do get it wet. If you take bandage off early, the area will bruise

## 2015-07-28 NOTE — Progress Notes (Signed)
Patient here for removal of her nexplanon only

## 2015-08-04 ENCOUNTER — Telehealth: Payer: Self-pay | Admitting: Internal Medicine

## 2015-08-04 NOTE — Telephone Encounter (Signed)
Patient submitted incomplete documents for financial assistance.Stephanie KitchenMarland KitchenI mailed the patient a letter stating the letter of residency needs to be notarized and we need three months of complete bank statements (patient submitted only the first page). I tried to call the patient on Friday, 07/30/15 and could not reach this patient.  I mailed her a letter on Friday indicating what I needed.  Patient called and left me a message to call her back.  I tried calling Wednesday, 08/04/15, at 9:35 am, but had to leave a message.

## 2015-08-24 MED FILL — PERPHENAZINE 4 MG TABLET: 4 | 30 days supply | Qty: 60 | Fill #1

## 2015-08-24 MED FILL — ?FLUOXETINE HCL 20MG TABLET: 20 | 30 days supply | Qty: 30 | Fill #1

## 2015-08-24 MED FILL — VENLAFAXINE HCL ER 37.5 MG: 37.5 | 30 days supply | Qty: 30 | Fill #0

## 2015-08-24 MED FILL — LISINOPRIL-HCTZ 20-25 MG TA: 20-25 | 30 days supply | Qty: 30 | Fill #1

## 2015-08-24 MED FILL — hydrOXYzine HCL 50 MG TABS: 50 | 30 days supply | Qty: 60 | Fill #1

## 2015-08-24 MED FILL — ?METFORMIN HCL 500MG TABLET: 500 | 30 days supply | Qty: 60 | Fill #1

## 2015-08-25 ENCOUNTER — Ambulatory Visit: Payer: Self-pay | Admitting: Internal Medicine

## 2015-09-30 MED FILL — PERPHENAZINE 4 MG TABLET: 4 | 30 days supply | Qty: 60 | Fill #2

## 2015-09-30 MED FILL — LISINOPRIL-HCTZ 20-25 MG TA: 20-25 | 30 days supply | Qty: 30 | Fill #2

## 2015-09-30 MED FILL — ?METFORMIN HCL 500MG TABLET: 500 | 30 days supply | Qty: 60 | Fill #2

## 2015-09-30 MED FILL — ?FLUOXETINE HCL 20MG TABLET: 20 | 30 days supply | Qty: 30 | Fill #2

## 2015-10-06 MED FILL — VENLAFAXINE HCL ER 75 MG CA: 75 | 30 days supply | Qty: 60 | Fill #1

## 2015-10-27 MED FILL — LISINOPRIL-HCTZ 20-25 MG TA: 20-25 | 30 days supply | Qty: 30 | Fill #3

## 2015-10-27 MED FILL — PERPHENAZINE 4 MG TABLET: 4 | 30 days supply | Qty: 60 | Fill #0

## 2015-10-27 MED FILL — ?FLUOXETINE HCL 20MG TABLET: 20 | 30 days supply | Qty: 60 | Fill #0

## 2015-10-27 MED FILL — hydrOXYzine HCL 50 MG TABS: 50 | 30 days supply | Qty: 60 | Fill #0

## 2015-11-26 MED FILL — ?FLUOXETINE HCL 20MG TABLET: 20 | 30 days supply | Qty: 60 | Fill #1

## 2015-11-26 MED FILL — LISINOPRIL-HCTZ 20-25 MG TA: 20-25 | 30 days supply | Qty: 30 | Fill #4

## 2015-11-26 MED FILL — metFORMIN HCL 500 MG TABS: 500 | 30 days supply | Qty: 60 | Fill #3

## 2015-11-26 MED FILL — hydrOXYzine HCL 50 MG TABS: 50 | 30 days supply | Qty: 60 | Fill #1

## 2015-11-26 MED FILL — PERPHENAZINE 4 MG TABLET: 4 | 30 days supply | Qty: 60 | Fill #1

## 2015-12-27 MED FILL — ?FLUOXETINE HCL 20MG TABLET: 20 | 30 days supply | Qty: 60 | Fill #2

## 2015-12-27 MED FILL — LISINOPRIL-HCTZ 20-25 MG TA: 20-25 | 30 days supply | Qty: 30 | Fill #5

## 2015-12-27 MED FILL — PERPHENAZINE 4 MG TABLET: 4 | 30 days supply | Qty: 60 | Fill #2

## 2016-01-13 ENCOUNTER — Telehealth: Payer: Self-pay | Admitting: Internal Medicine

## 2016-01-13 DIAGNOSIS — I10 Essential (primary) hypertension: Secondary | ICD-10-CM

## 2016-01-13 DIAGNOSIS — E119 Type 2 diabetes mellitus without complications: Secondary | ICD-10-CM

## 2016-01-13 MED ORDER — METFORMIN HCL 500 MG PO TABS: ORAL_TABLET | ORAL | Status: DC

## 2016-01-13 MED ORDER — LISINOPRIL-HYDROCHLOROTHIAZIDE 20-25 MG PO TABS
1.0000 | ORAL_TABLET | Freq: Every day | ORAL | Status: DC
Start: 2016-01-13 — End: 2016-02-07

## 2016-01-13 MED FILL — ?METFORMIN HCL 500MG TABLET: 500 | 30 days supply | Qty: 60 | Fill #0

## 2016-01-13 NOTE — Telephone Encounter (Signed)
Medication Refill: metFORMIN (GLUCOPHAGE) 500 MG tablet and lisinopril-hydrochlorothiazide (PRINZIDE,ZESTORETIC) 20-25 MG tablet  Pt called to make appointment but there are none available until August.  Pt states she is going out of town the first week in August and will be out of medication at that time Pt will call next week to schedule appt in second week of august to re-establish care

## 2016-01-13 NOTE — Telephone Encounter (Signed)
Requested medications refilled. Patient to re-establish care in August.

## 2016-01-17 ENCOUNTER — Other Ambulatory Visit: Payer: Self-pay | Admitting: Internal Medicine

## 2016-01-17 DIAGNOSIS — I10 Essential (primary) hypertension: Secondary | ICD-10-CM

## 2016-01-17 MED FILL — FLUoxetine HCL 20 MG CAPS: 20 | 30 days supply | Qty: 60 | Fill #0

## 2016-01-17 MED FILL — hydrOXYzine HCL 50 MG TABS: 50 | 30 days supply | Qty: 30 | Fill #0

## 2016-01-17 MED FILL — PERPHENAZINE 4 MG TABLET: 4 | 30 days supply | Qty: 60 | Fill #0

## 2016-01-17 MED FILL — LISINOPRIL-HCTZ 20-25 MG TA: 20-25 | 30 days supply | Qty: 30 | Fill #0

## 2016-01-17 NOTE — Telephone Encounter (Signed)
Rx request 

## 2016-02-07 ENCOUNTER — Ambulatory Visit: Payer: Self-pay | Attending: Internal Medicine | Admitting: Internal Medicine

## 2016-02-07 ENCOUNTER — Encounter: Payer: Self-pay | Admitting: Internal Medicine

## 2016-02-07 VITALS — BP 107/73 | HR 86 | Temp 98.2°F | Resp 16 | Wt 281.0 lb

## 2016-02-07 DIAGNOSIS — Z1321 Encounter for screening for nutritional disorder: Secondary | ICD-10-CM

## 2016-02-07 DIAGNOSIS — Z0000 Encounter for general adult medical examination without abnormal findings: Secondary | ICD-10-CM | POA: Insufficient documentation

## 2016-02-07 DIAGNOSIS — Z7984 Long term (current) use of oral hypoglycemic drugs: Secondary | ICD-10-CM | POA: Insufficient documentation

## 2016-02-07 DIAGNOSIS — I10 Essential (primary) hypertension: Secondary | ICD-10-CM | POA: Insufficient documentation

## 2016-02-07 DIAGNOSIS — Z23 Encounter for immunization: Secondary | ICD-10-CM

## 2016-02-07 DIAGNOSIS — Z114 Encounter for screening for human immunodeficiency virus [HIV]: Secondary | ICD-10-CM

## 2016-02-07 DIAGNOSIS — F17210 Nicotine dependence, cigarettes, uncomplicated: Secondary | ICD-10-CM | POA: Insufficient documentation

## 2016-02-07 DIAGNOSIS — F4310 Post-traumatic stress disorder, unspecified: Secondary | ICD-10-CM | POA: Insufficient documentation

## 2016-02-07 DIAGNOSIS — F17200 Nicotine dependence, unspecified, uncomplicated: Secondary | ICD-10-CM

## 2016-02-07 DIAGNOSIS — F329 Major depressive disorder, single episode, unspecified: Secondary | ICD-10-CM | POA: Insufficient documentation

## 2016-02-07 DIAGNOSIS — Z1159 Encounter for screening for other viral diseases: Secondary | ICD-10-CM

## 2016-02-07 DIAGNOSIS — E119 Type 2 diabetes mellitus without complications: Secondary | ICD-10-CM | POA: Insufficient documentation

## 2016-02-07 DIAGNOSIS — R7303 Prediabetes: Secondary | ICD-10-CM

## 2016-02-07 DIAGNOSIS — Z720 Tobacco use: Secondary | ICD-10-CM

## 2016-02-07 DIAGNOSIS — F32A Depression, unspecified: Secondary | ICD-10-CM

## 2016-02-07 LAB — CBC WITH DIFFERENTIAL/PLATELET
Basophils Absolute: 0 cells/uL (ref 0–200)
Basophils Relative: 0 %
Eosinophils Absolute: 510 cells/uL — ABNORMAL HIGH (ref 15–500)
Eosinophils Relative: 6 %
HCT: 38.9 % (ref 35.0–45.0)
Hemoglobin: 12.9 g/dL (ref 11.7–15.5)
LYMPHS ABS: 2380 {cells}/uL (ref 850–3900)
MCH: 29.7 pg (ref 27.0–33.0)
MCHC: 33.2 g/dL (ref 32.0–36.0)
MCV: 89.6 fL (ref 80.0–100.0)
MPV: 9.4 fL (ref 7.5–12.5)
Monocytes Absolute: 680 cells/uL (ref 200–950)
Monocytes Relative: 8 %
NEUTROS PCT: 58 %
Neutro Abs: 4930 cells/uL (ref 1500–7800)
PLATELETS: 287 10*3/uL (ref 140–400)
RBC: 4.34 MIL/uL (ref 3.80–5.10)
RDW: 14.3 % (ref 11.0–15.0)
WBC: 8.5 10*3/uL (ref 3.8–10.8)

## 2016-02-07 LAB — BASIC METABOLIC PANEL WITH GFR
BUN: 18 mg/dL (ref 7–25)
CO2: 25 mmol/L (ref 20–31)
Calcium: 9.2 mg/dL (ref 8.6–10.4)
Chloride: 102 mmol/L (ref 98–110)
Creat: 0.96 mg/dL (ref 0.50–1.05)
GFR, EST AFRICAN AMERICAN: 79 mL/min (ref >=60)
GFR, Est Non African American: 68 mL/min (ref >=60)
GLUCOSE: 101 mg/dL — AB (ref 65–99)
POTASSIUM: 4.3 mmol/L (ref 3.5–5.3)
Sodium: 137 mmol/L (ref 135–146)

## 2016-02-07 LAB — GLUCOSE, POCT (MANUAL RESULT ENTRY): POC GLUCOSE: 141 mg/dL — AB (ref 70–99)

## 2016-02-07 LAB — POCT GLYCOSYLATED HEMOGLOBIN (HGB A1C): HEMOGLOBIN A1C: 5.4

## 2016-02-07 MED ORDER — LISINOPRIL-HYDROCHLOROTHIAZIDE 20-12.5 MG PO TABS: 1.0000 | ORAL_TABLET | Freq: Every day | ORAL | 3 refills | Status: DC

## 2016-02-07 MED ORDER — METFORMIN HCL ER 500 MG PO TB24: 500.0000 mg | ORAL_TABLET | Freq: Every day | ORAL | 3 refills | Status: DC

## 2016-02-07 NOTE — Patient Instructions (Addendum)
Financial assistance packet.  DASH Eating Plan DASH stands for "Dietary Approaches to Stop Hypertension." The DASH eating plan is a healthy eating plan that has been shown to reduce high blood pressure (hypertension). Additional health benefits may include reducing the risk of type 2 diabetes mellitus, heart disease, and stroke. The DASH eating plan may also help with weight loss. WHAT DO I NEED TO KNOW ABOUT THE DASH EATING PLAN? For the DASH eating plan, you will follow these general guidelines:  Choose foods with a percent daily value for sodium of less than 5% (as listed on the food label).  Use salt-free seasonings or herbs instead of table salt or sea salt.  Check with your health care provider or pharmacist before using salt substitutes.  Eat lower-sodium products, often labeled as "lower sodium" or "no salt added."  Eat fresh foods.  Eat more vegetables, fruits, and low-fat dairy products.  Choose whole grains. Look for the word "whole" as the first word in the ingredient list.  Choose fish and skinless chicken or [place] more often than red meat. Limit fish, poultry, and meat to 6 oz (170 g) each day.  Limit sweets, desserts, sugars, and sugary drinks.  Choose heart-healthy fats.  Limit cheese to 1 oz (28 g) per day.  Eat more home-cooked food and less restaurant, buffet, and fast food.  Limit fried foods.  Cook foods using methods other than frying.  Limit canned vegetables. If you do use them, rinse them well to decrease the sodium.  When eating at a restaurant, ask that your food be prepared with less salt, or no salt if possible. WHAT FOODS CAN I EAT? Seek help from a dietitian for individual calorie needs. Grains Whole grain or whole wheat bread. Brown rice. Whole grain or whole wheat pasta. Quinoa, bulgur, and whole grain cereals. Low-sodium cereals. Corn or whole wheat flour tortillas. Whole grain cornbread. Whole grain crackers. Low-sodium  crackers. Vegetables Fresh or frozen vegetables (raw, steamed, roasted, or grilled). Low-sodium or reduced-sodium tomato and vegetable juices. Low-sodium or reduced-sodium tomato sauce and paste. Low-sodium or reduced-sodium canned vegetables.  Fruits All fresh, canned (in natural juice), or frozen fruits. Meat and Other Protein Products Ground beef (85% or leaner), grass-fed beef, or beef trimmed of fat. Skinless chicken or [place]. Ground chicken or [place]. Pork trimmed of fat. All fish and seafood. Eggs. Dried beans, peas, or lentils. Unsalted nuts and seeds. Unsalted canned beans. Dairy Low-fat dairy products, such as skim or 1% milk, 2% or reduced-fat cheeses, low-fat ricotta or cottage cheese, or plain low-fat yogurt. Low-sodium or reduced-sodium cheeses. Fats and Oils Tub margarines without trans fats. Light or reduced-fat mayonnaise and salad dressings (reduced sodium). Avocado. Safflower, olive, or canola oils. Natural peanut or almond butter. Other Unsalted popcorn and pretzels. The items listed above may not be a complete list of recommended foods or beverages. Contact your dietitian for more options. WHAT FOODS ARE NOT RECOMMENDED? Grains White bread. White pasta. White rice. Refined cornbread. Bagels and croissants. Crackers that contain trans fat. Vegetables Creamed or fried vegetables. Vegetables in a cheese sauce. Regular canned vegetables. Regular canned tomato sauce and paste. Regular tomato and vegetable juices. Fruits Dried fruits. Canned fruit in light or heavy syrup. Fruit juice. Meat and Other Protein Products Fatty cuts of meat. Ribs, chicken wings, bacon, sausage, bologna, salami, chitterlings, fatback, hot dogs, bratwurst, and packaged luncheon meats. Salted nuts and seeds. Canned beans with salt. Dairy Whole or 2% milk, cream, half-and-half, and cream cheese. Whole-fat  or sweetened yogurt. Full-fat cheeses or blue cheese. Nondairy creamers and whipped toppings.  Processed cheese, cheese spreads, or cheese curds. Condiments Onion and garlic salt, seasoned salt, table salt, and sea salt. Canned and packaged gravies. Worcestershire sauce. Tartar sauce. Barbecue sauce. Teriyaki sauce. Soy sauce, including reduced sodium. Steak sauce. Fish sauce. Oyster sauce. Cocktail sauce. Horseradish. Ketchup and mustard. Meat flavorings and tenderizers. Bouillon cubes. Hot sauce. Tabasco sauce. Marinades. Taco seasonings. Relishes. Fats and Oils Butter, stick margarine, lard, shortening, ghee, and bacon fat. Coconut, palm kernel, or palm oils. Regular salad dressings. Other Pickles and olives. Salted popcorn and pretzels. The items listed above may not be a complete list of foods and beverages to avoid. Contact your dietitian for more information. WHERE CAN I FIND MORE INFORMATION? National Heart, Lung, and Blood Institute: travelstabloid.com   This information is not intended to replace advice given to you by your health care provider. Make sure you discuss any questions you have with your health care provider.   Document Released: 06/01/2011 Document Revised: 07/03/2014 Document Reviewed: 04/16/2013 Elsevier Interactive Patient Education 2016 Elsevier Inc.  - Diabetes Mellitus and Food It is important for you to manage your blood sugar (glucose) level. Your blood glucose level can be greatly affected by what you eat. Eating healthier foods in the appropriate amounts throughout the day at about the same time each day will help you control your blood glucose level. It can also help slow or prevent worsening of your diabetes mellitus. Healthy eating may even help you improve the level of your blood pressure and reach or maintain a healthy weight.  General recommendations for healthful eating and cooking habits include:  Eating meals and snacks regularly. Avoid going long periods of time without eating to lose weight.  Eating a diet  that consists mainly of plant-based foods, such as fruits, vegetables, nuts, legumes, and whole grains.  Using low-heat cooking methods, such as baking, instead of high-heat cooking methods, such as deep frying. Work with your dietitian to make sure you understand how to use the Nutrition Facts information on food labels. HOW CAN FOOD AFFECT ME? Carbohydrates Carbohydrates affect your blood glucose level more than any other type of food. Your dietitian will help you determine how many carbohydrates to eat at each meal and teach you how to count carbohydrates. Counting carbohydrates is important to keep your blood glucose at a healthy level, especially if you are using insulin or taking certain medicines for diabetes mellitus. Alcohol Alcohol can cause sudden decreases in blood glucose (hypoglycemia), especially if you use insulin or take certain medicines for diabetes mellitus. Hypoglycemia can be a life-threatening condition. Symptoms of hypoglycemia (sleepiness, dizziness, and disorientation) are similar to symptoms of having too much alcohol.  If your health care provider has given you approval to drink alcohol, do so in moderation and use the following guidelines:  Women should not have more than one drink per day, and men should not have more than two drinks per day. One drink is equal to:  12 oz of beer.  5 oz of wine.  1 oz of hard liquor.  Do not drink on an empty stomach.  Keep yourself hydrated. Have water, diet soda, or unsweetened iced tea.  Regular soda, juice, and other mixers might contain a lot of carbohydrates and should be counted. WHAT FOODS ARE NOT RECOMMENDED? As you make food choices, it is important to remember that all foods are not the same. Some foods have fewer nutrients per serving  than other foods, even though they might have the same number of calories or carbohydrates. It is difficult to get your body what it needs when you eat foods with fewer nutrients.  Examples of foods that you should avoid that are high in calories and carbohydrates but low in nutrients include:  Trans fats (most processed foods list trans fats on the Nutrition Facts label).  Regular soda.  Juice.  Candy.  Sweets, such as cake, pie, doughnuts, and cookies.  Fried foods. WHAT FOODS CAN I EAT? Eat nutrient-rich foods, which will nourish your body and keep you healthy. The food you should eat also will depend on several factors, including:  The calories you need.  The medicines you take.  Your weight.  Your blood glucose level.  Your blood pressure level.  Your cholesterol level. You should eat a variety of foods, including:  Protein.  Lean cuts of meat.  Proteins low in saturated fats, such as fish, egg whites, and beans. Avoid processed meats.  Fruits and vegetables.  Fruits and vegetables that may help control blood glucose levels, such as apples, mangoes, and yams.  Dairy products.  Choose fat-free or low-fat dairy products, such as milk, yogurt, and cheese.  Grains, bread, pasta, and rice.  Choose whole grain products, such as multigrain bread, whole oats, and brown rice. These foods may help control blood pressure.  Fats.  Foods containing healthful fats, such as nuts, avocado, olive oil, canola oil, and fish. DOES EVERYONE WITH DIABETES MELLITUS HAVE THE SAME MEAL PLAN? Because every person with diabetes mellitus is different, there is not one meal plan that works for everyone. It is very important that you meet with a dietitian who will help you create a meal plan that is just right for you.   This information is not intended to replace advice given to you by your health care provider. Make sure you discuss any questions you have with your health care provider.   Document Released: 03/09/2005 Document Revised: 07/03/2014 Document Reviewed: 05/09/2013 Elsevier Interactive Patient Education 2016 [place] for Eating Away  From Home If You Have Diabetes Controlling your level of blood glucose, also known as blood sugar, can be challenging. It can be even more difficult when you do not prepare your own meals. The following tips can help you manage your diabetes when you eat away from home. PLANNING AHEAD Plan ahead if you know you will be eating away from home:  Ask your health care provider how to time meals and medicine if you are taking insulin.  Make a list of restaurants near you that offer healthy choices. If they have a carry-out menu, take it home and plan what you will order ahead of time.  Look up the restaurant you want to eat at online. Many chain and fast-food restaurants list nutritional information online. Use this information to choose the healthiest options and to calculate how many carbohydrates will be in your meal.  Use a carbohydrate-counting book or mobile app to look up the carbohydrate content and serving size of the foods you want to eat.  Become familiar with serving sizes and learn to recognize how many servings are in a portion. This will allow you to estimate how many carbohydrates you can eat. FREE FOODS A "free food" is any food or drink that has less than 5 g of carbohydrates per serving. Free foods include:  Many vegetables.  Hard boiled eggs.  Nuts or seeds.  Olives.  Cheeses.  Meats. These types of foods make good appetizer choices and are often available at salad bars. Lemon juice, vinegar, or a low-calorie salad dressing of fewer than 20 calories per serving can be used as a "free" salad dressing.  CHOICES TO REDUCE CARBOHYDRATES  Substitute nonfat sweetened yogurt with a sugar-free yogurt. Yogurt made from soy milk may also be used, but you will still want a sugar-free or plain option to choose a lower carbohydrate amount.  Ask your server to take away the bread basket or chips from your table.  Order fresh fruit. A salad bar often offers fresh fruit choices.  Avoid canned fruit because it is usually packed in sugar or syrup.  Order a salad, and eat it without dressing. Or, create a "free" salad dressing.  Ask for substitutions. For example, instead of [place] fries, request an order of a vegetable such as salad, green beans, or broccoli. OTHER TIPS   If you take insulin, take the insulin once your food arrives to your table. This will ensure your insulin and food are timed correctly.  Ask your server about the portion size before your order, and ask for a take-out box if the portion has more servings than you should have. When your food comes, leave the amount you should have on the plate, and put the rest in the take-out box.  Consider splitting an entree with someone and ordering a side salad.   This information is not intended to replace advice given to you by your health care provider. Make sure you discuss any questions you have with your health care provider.   Document Released: 06/12/2005 Document Revised: 03/03/2015 Document Reviewed: 09/09/2013 Elsevier Interactive Patient Education 2016 [employer].  -  Diabetes and Exercise Exercising regularly is important. It is not just about losing weight. It has many health benefits, such as:  Improving your overall fitness, flexibility, and endurance.  Increasing your bone density.  Helping with weight control.  Decreasing your body fat.  Increasing your muscle strength.  Reducing stress and tension.  Improving your overall health. People with diabetes who exercise gain additional benefits because exercise:  Reduces appetite.  Improves the body's use of blood sugar (glucose).  Helps lower or control blood glucose.  Decreases blood pressure.  Helps control blood lipids (such as cholesterol and triglycerides).  Improves the body's use of the hormone insulin by:  Increasing the body's insulin sensitivity.  Reducing the body's insulin needs.  Decreases the risk for  heart disease because exercising:  Lowers cholesterol and triglycerides levels.  Increases the levels of good cholesterol (such as high-density lipoproteins [HDL]) in the body.  Lowers blood glucose levels. YOUR ACTIVITY PLAN  Choose an activity that you enjoy, and set realistic goals. To exercise safely, you should begin practicing any new physical activity slowly, and gradually increase the intensity of the exercise over time. Your health care provider or diabetes educator can help create an activity plan that works for you. General recommendations include:  Encouraging children to engage in at least 60 minutes of physical activity each day.  Stretching and performing strength training exercises, such as yoga or weight lifting, at least 2 times per week.  Performing a total of at least 150 minutes of moderate-intensity exercise each week, such as brisk walking or water aerobics.  Exercising at least 3 days per week, making sure you allow no more than 2 consecutive days to pass without exercising.  Avoiding long periods of inactivity (90 minutes or  more). When you have to spend an extended period of time sitting down, take frequent breaks to walk or stretch. RECOMMENDATIONS FOR EXERCISING WITH TYPE 1 OR TYPE 2 DIABETES   Check your blood glucose before exercising. If blood glucose levels are greater than 240 mg/dL, check for urine ketones. Do not exercise if ketones are present.  Avoid injecting insulin into areas of the body that are going to be exercised. For example, avoid injecting insulin into:  The arms when playing tennis.  The legs when jogging.  Keep a record of:  Food intake before and after you exercise.  Expected peak times of insulin action.  Blood glucose levels before and after you exercise.  The type and amount of exercise you have done.  Review your records with your health care provider. Your health care provider will help you to develop guidelines for  adjusting food intake and insulin amounts before and after exercising.  If you take insulin or oral hypoglycemic agents, watch for signs and symptoms of hypoglycemia. They include:  Dizziness.  Shaking.  Sweating.  Chills.  Confusion.  Drink plenty of water while you exercise to prevent dehydration or heat stroke. Body water is lost during exercise and must be replaced.  Talk to your health care provider before starting an exercise program to make sure it is safe for you. Remember, almost any type of activity is better than none.   This information is not intended to replace advice given to you by your health care provider. Make sure you discuss any questions you have with your health care provider.   Document Released: 09/02/2003 Document Revised: 10/27/2014 Document Reviewed: 11/19/2012 Elsevier Interactive Patient Education 2016 [place] Can Quit Smoking If you are ready to quit smoking or are thinking about it, congratulations! You have chosen to help yourself be healthier and live longer! There are lots of different ways to quit smoking. Nicotine gum, nicotine patches, a nicotine inhaler, or nicotine nasal spray can help with physical craving. Hypnosis, support groups, and medicines help break the habit of smoking. TIPS TO GET OFF AND STAY OFF CIGARETTES Learn to predict your moods. Do not let a bad situation be your excuse to have a cigarette. Some situations in your life might tempt you to have a cigarette. Ask friends and co-workers not to smoke around you. Make your home smoke-free. Never have "just one" cigarette. It leads to wanting another and another. Remind yourself of your decision to quit. On a card, make a list of your reasons for not smoking. Read it at least the same number of times a day as you have a cigarette. Tell yourself everyday, "I do not want to smoke. I choose not to smoke." Ask someone at home or work to help you with your plan to quit  smoking. Have something planned after you eat or have a cup of coffee. Take a walk or get other exercise to perk you up. This will help to keep you from overeating. Try a relaxation exercise to calm you down and decrease your stress. Remember, you may be tense and nervous the first two weeks after you quit. This will pass. Find new activities to keep your hands busy. Play with a pen, coin, or rubber band. Doodle or draw things on paper. Brush your teeth right after eating. This will help cut down the craving for the taste of tobacco after meals. You can try mouthwash too. Try gum, breath mints, or diet  candy to keep something in your mouth. IF YOU SMOKE AND WANT TO QUIT: Do not stock up on cigarettes. Never buy a carton. Wait until one pack is finished before you buy another. Never carry cigarettes with you at work or at home. Keep cigarettes as far away from you as possible. Leave them with someone else. Never carry matches or a lighter with you. Ask yourself, "Do I need this cigarette or is this just a reflex?" Bet with someone that you can quit. Put cigarette money in a piggy bank every morning. If you smoke, you give up the money. If you do not smoke, by the end of the week, you keep the money. Keep trying. It takes 21 days to change a habit! Talk to your doctor about using medicines to help you quit. These include nicotine replacement gum, lozenges, or skin patches.   This information is not intended to replace advice given to you by your health care provider. Make sure you discuss any questions you have with your health care provider.   Document Released: 04/08/2009 Document Revised: 09/04/2011 Document Reviewed: 04/08/2009 Elsevier Interactive Patient Education 2016 Elsevier Inc. Pneumococcal Conjugate Vaccine (PCV13)  1. Why get vaccinated? Vaccination can protect both children and adults from pneumococcal disease. Pneumococcal disease is caused by bacteria that can spread from person  to person through close contact. It can cause ear infections, and it can also lead to more serious infections of the:  Lungs (pneumonia),  Blood (bacteremia), and  Covering of the brain and spinal cord (meningitis). Pneumococcal pneumonia is most common among adults. Pneumococcal meningitis can cause deafness and brain damage, and it kills about 1 child in 10 who get it. Anyone can get pneumococcal disease, but children under 71 years of age and adults 72 years and older, people with certain medical conditions, and cigarette smokers are at the highest risk. Before there was a vaccine, the [place] saw:  more than 700 cases of meningitis,  about 13,000 blood infections,  about 5 million ear infections, and  about 200 deaths in children under 5 each year from pneumococcal disease. Since vaccine became available, severe pneumococcal disease in these children has fallen by 88%. About 18,000 older adults die of pneumococcal disease each year in the [place]. Treatment of pneumococcal infections with penicillin and other drugs is not as effective as it used to be, because some strains of the disease have become resistant to these drugs. This makes prevention of the disease, through vaccination, even more important. 2. PCV13 vaccine Pneumococcal conjugate vaccine (called PCV13) protects against 13 types of pneumococcal bacteria. PCV13 is routinely given to children at 2, 4, 6, and 60-38 months of age. It is also recommended for children and adults 40 to 58 years of age with certain health conditions, and for all adults 83 years of age and older. Your doctor can give you details. 3. Some people should not get this vaccine Anyone who has ever had a life-threatening allergic reaction to a dose of this vaccine, to an earlier pneumococcal vaccine called PCV7, or to any vaccine containing diphtheria toxoid (for example, DTaP), should not get PCV13. Anyone with a severe allergy to any component  of PCV13 should not get the vaccine. Tell your doctor if the person being vaccinated has any severe allergies. If the person scheduled for vaccination is not feeling well, your healthcare provider might decide to reschedule the shot on another day. 4. Risks of a vaccine reaction With any medicine, including  vaccines, there is a chance of reactions. These are usually mild and go away on their own, but serious reactions are also possible. Problems reported following PCV13 varied by age and dose in the series. The most common problems reported among children were:  About half became drowsy after the shot, had a temporary loss of appetite, or had redness or tenderness where the shot was given.  About 1 out of 3 had swelling where the shot was given.  About 1 out of 3 had a mild fever, and about 1 in 20 had a fever over 102.52F.  Up to about 8 out of 10 became fussy or irritable. Adults have reported pain, redness, and swelling where the shot was given; also mild fever, fatigue, headache, chills, or muscle pain. Young children who get PCV13 along with inactivated flu vaccine at the same time may be at increased risk for seizures caused by fever. Ask your doctor for more information. Problems that could happen after any vaccine:  People sometimes faint after a medical procedure, including vaccination. Sitting or lying down for about 15 minutes can help prevent fainting, and injuries caused by a fall. Tell your doctor if you feel dizzy, or have vision changes or ringing in the ears.  Some older children and adults get severe pain in the shoulder and have difficulty moving the arm where a shot was given. This happens very rarely.  Any medication can cause a severe allergic reaction. Such reactions from a vaccine are very rare, estimated at about 1 in a million doses, and would happen within a few minutes to a few hours after the vaccination. As with any medicine, there is a very small chance of a  vaccine causing a serious injury or death. The safety of vaccines is always being monitored. For more information, visit: http://www.aguilar.org/ 5. What if there is a serious reaction? What should I look for?  Look for anything that concerns you, such as signs of a severe allergic reaction, very high fever, or unusual behavior. Signs of a severe allergic reaction can include hives, swelling of the face and throat, difficulty breathing, a fast heartbeat, dizziness, and weakness-usually within a few minutes to a few hours after the vaccination. What should I do?  If you think it is a severe allergic reaction or other emergency that can't wait, call 9-1-1 or get the person to the nearest hospital. Otherwise, call your doctor. Reactions should be reported to the Vaccine Adverse Event Reporting System (VAERS). Your doctor should file this report, or you can do it yourself through the VAERS web site at www.vaers.SamedayNews.es, or by calling 304-708-5520. VAERS does not give medical advice. 6. The National Vaccine Injury Compensation Program The [employer] Vaccine Injury Compensation Program (VICP) is a federal program that was created to compensate people who may have been injured by certain vaccines. Persons who believe they may have been injured by a vaccine can learn about the program and about filing a claim by calling 406-280-9065 or visiting the [place] website at GoldCloset.com.ee. There is a time limit to file a claim for compensation. 7. How can I learn more?  Ask your healthcare provider. He or she can give you the vaccine package insert or suggest other sources of information.  Call your local or state health department.  Contact the Centers for Disease Control and Prevention (CDC):  Call 914-030-5076 (1-800-CDC-INFO) or  Visit CDC's website at http://hunter.com/ Vaccine Information Statement PCV13 Vaccine (04/30/2014)   This information is not intended to  replace  advice given to you by your health care provider. Make sure you discuss any questions you have with your health care provider.   Document Released: 04/09/2006 Document Revised: 07/03/2014 Document Reviewed: 05/07/2014 Elsevier Interactive Patient Education 2016 [employer]. Tdap Vaccine (Tetanus, Diphtheria and Pertussis): What You Need to Know 1. Why get vaccinated? Tetanus, diphtheria and pertussis are very serious diseases. Tdap vaccine can protect [place] from these diseases. And, Tdap vaccine given to pregnant women can protect newborn babies against pertussis. TETANUS (Lockjaw) is rare in the [place] today. It causes painful muscle tightening and stiffness, usually all over the body.  It can lead to tightening of muscles in the head and neck so you can't open your mouth, swallow, or sometimes even breathe. Tetanus kills about 1 out of 10 people who are infected even after receiving the best medical care. DIPHTHERIA is also rare in the [place] today. It can cause a thick coating to form in the back of the throat.  It can lead to breathing problems, heart failure, paralysis, and death. PERTUSSIS (Whooping Cough) causes severe coughing spells, which can cause difficulty breathing, vomiting and disturbed sleep.  It can also lead to weight loss, incontinence, and rib fractures. Up to 2 in 100 adolescents and 5 in 100 adults with pertussis are hospitalized or have complications, which could include pneumonia or death. These diseases are caused by bacteria. Diphtheria and pertussis are spread from person to person through secretions from coughing or sneezing. Tetanus enters the body through cuts, scratches, or wounds. Before vaccines, as many as 200,000 cases of diphtheria, 200,000 cases of pertussis, and hundreds of cases of tetanus, were reported in the [place] each year. Since vaccination began, reports of cases for tetanus and diphtheria have dropped by about 99% and for pertussis  by about 80%. 2. Tdap vaccine Tdap vaccine can protect adolescents and adults from tetanus, diphtheria, and pertussis. One dose of Tdap is routinely given at age 31 or 40. People who did not get Tdap at that age should get it as soon as possible. Tdap is especially important for healthcare professionals and anyone having close contact with a baby younger than 12 months. Pregnant women should get a dose of Tdap during every pregnancy, to protect the newborn from pertussis. Infants are most at risk for severe, life-threatening complications from pertussis. Another vaccine, called Td, protects against tetanus and diphtheria, but not pertussis. A Td booster should be given every 10 years. Tdap may be given as one of these boosters if you have never gotten Tdap before. Tdap may also be given after a severe cut or burn to prevent tetanus infection. Your doctor or the person giving you the vaccine can give you more information. Tdap may safely be given at the same time as other vaccines. 3. Some people should not get this vaccine  A person who has ever had a life-threatening allergic reaction after a previous dose of any diphtheria, tetanus or pertussis containing vaccine, OR has a severe allergy to any part of this vaccine, should not get Tdap vaccine. Tell the person giving the vaccine about any severe allergies.  Anyone who had coma or long repeated seizures within 7 days after a childhood dose of DTP or DTaP, or a previous dose of Tdap, should not get Tdap, unless a cause other than the vaccine was found. They can still get Td.  Talk to your doctor if you:  have seizures or another nervous system problem,  had severe pain or swelling after any vaccine containing diphtheria, tetanus or pertussis,  ever had a condition called Guillain-Barr Syndrome (GBS),  aren't feeling well on the day the shot is scheduled. 4. Risks With any medicine, including vaccines, there is a chance of side effects. These  are usually mild and go away on their own. Serious reactions are also possible but are rare. Most people who get Tdap vaccine do not have any problems with it. Mild problems following Tdap (Did not interfere with activities)  Pain where the shot was given (about 3 in 4 adolescents or 2 in 3 adults)  Redness or swelling where the shot was given (about 1 person in 5)  Mild fever of at least 100.30F (up to about 1 in 25 adolescents or 1 in 100 adults)  Headache (about 3 or 4 people in 10)  Tiredness (about 1 person in 3 or 4)  Nausea, vomiting, diarrhea, stomach ache (up to 1 in 4 adolescents or 1 in 10 adults)  Chills, sore joints (about 1 person in 10)  Body aches (about 1 person in 3 or 4)  Rash, swollen glands (uncommon) Moderate problems following Tdap (Interfered with activities, but did not require medical attention)  Pain where the shot was given (up to 1 in 5 or 6)  Redness or swelling where the shot was given (up to about 1 in 16 adolescents or 1 in 12 adults)  Fever over 102F (about 1 in 100 adolescents or 1 in 250 adults)  Headache (about 1 in 7 adolescents or 1 in 10 adults)  Nausea, vomiting, diarrhea, stomach ache (up to 1 or 3 people in 100)  Swelling of the entire arm where the shot was given (up to about 1 in 500). Severe problems following Tdap (Unable to perform usual activities; required medical attention)  Swelling, severe pain, bleeding and redness in the arm where the shot was given (rare). Problems that could happen after any vaccine:  People sometimes faint after a medical procedure, including vaccination. Sitting or lying down for about 15 minutes can help prevent fainting, and injuries caused by a fall. Tell your doctor if you feel dizzy, or have vision changes or ringing in the ears.  Some people get severe pain in the shoulder and have difficulty moving the arm where a shot was given. This happens very rarely.  Any medication can cause a  severe allergic reaction. Such reactions from a vaccine are very rare, estimated at fewer than 1 in a million doses, and would happen within a few minutes to a few hours after the vaccination. As with any medicine, there is a very remote chance of a vaccine causing a serious injury or death. The safety of vaccines is always being monitored. For more information, visit: http://www.aguilar.org/ 5. What if there is a serious problem? What should I look for?  Look for anything that concerns you, such as signs of a severe allergic reaction, very high fever, or unusual behavior.  Signs of a severe allergic reaction can include hives, swelling of the face and throat, difficulty breathing, a fast heartbeat, dizziness, and weakness. These would usually start a few minutes to a few hours after the vaccination. What should I do?  If you think it is a severe allergic reaction or other emergency that can't wait, call 9-1-1 or get the person to the nearest hospital. Otherwise, call your doctor.  Afterward, the reaction should be reported to the Vaccine Adverse Event Reporting System (VAERS). Your  doctor might file this report, or you can do it yourself through the VAERS web site at www.vaers.SamedayNews.es, or by calling [phone]. VAERS does not give medical advice.  6. The National Vaccine Injury Compensation Program The [employer] Vaccine Injury Compensation Program (VICP) is a federal program that was created to compensate people who may have been injured by certain vaccines. Persons who believe they may have been injured by a vaccine can learn about the program and about filing a claim by calling (667)887-6890 or visiting the [place] website at GoldCloset.com.ee. There is a time limit to file a claim for compensation. 7. How can I learn more?  Ask your doctor. He or she can give you the vaccine package insert or suggest other sources of information.  Call your local or state health  department.  Contact the Centers for Disease Control and Prevention (CDC):  Call (843) 464-2187 (1-800-CDC-INFO) or  Visit CDC's website at http://hunter.com/ CDC Tdap Vaccine VIS (08/19/13)   This information is not intended to replace advice given to you by your health care provider. Make sure you discuss any questions you have with your health care provider.   Document Released: 12/12/2011 Document Revised: 07/03/2014 Document Reviewed: 09/24/2013 Elsevier Interactive Patient Education [employer].

## 2016-02-07 NOTE — Progress Notes (Signed)
Pt is in the office today for re-establishment  Pt states she would like her medications refilled Pt states she is not in any pain Pt states she is not able to make it to the bathroom in time to make a bowel movement Pt states her bowel movement are like yogurt

## 2016-02-07 NOTE — Progress Notes (Signed)
[name], is a 50 y.o. female  [id number]  [id number]  DOB - [birth year]  CC: No chief complaint on file.      HPI: [name] is a 50 y.o. female here today to establish medical care, last seen in clinic 2/17 for Nexplanon removal, w/ signif. PMHX of dm, htn, depression/PTSD, morbid obesity, child hearing loss, wears bilat hearing aids.  Pt still smoking 1/2 ppd.  Pt c/o of loose diarrhea last 2-3 wks.   On paroxetine 20mg  po qday per Psychiatry, Dr Billie Ruddy.  Feels well overall, no si/hi/avh.  Patient has No headache, No chest pain, No abdominal pain - No Nausea, No new weakness tingling or numbness, No Cough - SOB.    Review of Systems: Per HPI, o/w all systems reviewed and negative.   Allergies  Allergen Reactions  . Latex Rash   Past Medical History:  Diagnosis Date  . Depression   . Diabetes mellitus   . Hypertension   . Obesity   . PTSD (post-traumatic stress disorder)    Current Outpatient Prescriptions on File Prior to Visit  Medication Sig Dispense Refill  . amoxicillin-clavulanate (AUGMENTIN) 875-125 MG tablet Take 1 tablet by mouth 2 (two) times daily. 20 tablet 0  . azithromycin (ZITHROMAX) 250 MG tablet Take 1 tablet (250 mg total) by mouth daily. Take first 2 tablets together, then 1 every day until finished. 6 tablet 0  . cholecalciferol (VITAMIN D) 1000 UNITS tablet Take 1,000 Units by mouth daily.    [name] etonogestrel (NEXPLANON) 68 MG IMPL implant Inject 1 each into the skin once.    . fluticasone (FLONASE) 50 MCG/ACT nasal spray Place 1 spray into both nostrils daily as needed for allergies or rhinitis. 16 g 5  . lisinopril-hydrochlorothiazide (PRINZIDE,ZESTORETIC) 20-25 MG tablet Take 1 tablet by mouth daily. 30 tablet 0  . metFORMIN (GLUCOPHAGE) 500 MG tablet TAKE 1 TABLET (500 MG TOTAL) BY MOUTH 2 (TWO) TIMES DAILY WITH A MEAL. 60 tablet 0  . perphenazine (TRILAFON) 8 MG tablet Take 1 tablet (8 mg total) by mouth at bedtime. 30  tablet 0  . predniSONE (DELTASONE) 50 MG tablet Take 1 tablet (50 mg total) by mouth daily. 5 tablet 0  . Terbinafine (LAMISIL ADVANCED) 1 % GEL May apply twice daily 12 g 1  . venlafaxine XR (EFFEXOR-XR) 75 MG 24 hr capsule Take 3 capsules (225 mg total) by mouth daily with breakfast. 90 capsule 3   No current facility-administered medications on file prior to visit.    Family History  Problem Relation Age of Onset  . Diabetes Mother   . Depression Mother   . Hypertension Mother   . Stroke Paternal Grandmother   . Stroke Paternal Grandfather    Social History   Social History  . Marital status: Single    Spouse name: N/A  . Number of children: N/A  . Years of education: N/A   Occupational History  . Not on file.   Social History Main Topics  . Smoking status: Current Every Day Smoker  . Smokeless tobacco: Not on file  . Alcohol use Yes     Comment: occasional  . Drug use:     Types: Marijuana  . Sexual activity: Not on file   Other Topics Concern  . Not on file   Social History Narrative  . No narrative on file    Objective:  There were no vitals filed for this visit.  There were no vitals filed for this visit.  BP Readings from Last 3 Encounters:  07/28/15 115/80  07/07/15 132/76  07/01/15 132/82    Physical Exam: Constitutional: Patient appears well-developed and well-nourished. No distress. AAOx3, morbid obesity. HENT: Normocephalic, atraumatic, External right and left ear normal. Oropharynx is clear and moist.  bilat TMS clear, wears bilat hearing aids. Eyes: Conjunctivae and EOM are normal. PERRL, no scleral icterus. Neck: Normal ROM. Neck supple. No JVD.  CVS: RRR, S1/S2 +, no murmurs, no gallops, no carotid bruit.  Pulmonary: Effort and breath sounds normal, no stridor, rhonchi, wheezes, rales.  Abdominal: Soft. BS +, no distension, tenderness, rebound or guarding.  Musculoskeletal: Normal range of motion. No edema and no tenderness.  LE: bilat/  no c/c/e, pulses 2+ bilateral. Neuro: Alert. muscle tone coordination wnl. No cranial nerve deficit grossly. Skin: Skin is warm and dry. No rash noted. Not diaphoretic. No erythema. No pallor. Psychiatric: Normal mood and affect. Behavior, judgment, thought content normal.  Lab Results  Component Value Date   WBC 8.1 09/04/2013   HGB 14.3 09/17/2014   HCT 42.0 09/17/2014   MCV 88.8 09/04/2013   PLT 295 09/04/2013   Lab Results  Component Value Date   CREATININE 0.70 07/07/2015   BUN 13 07/07/2015   NA 136 07/07/2015   K 4.6 07/07/2015   CL 102 07/07/2015   CO2 26 07/07/2015    Lab Results  Component Value Date   HGBA1C 5.80 07/07/2015   Lipid Panel     Component Value Date/Time   CHOL 160 09/04/2013 0953   TRIG 98 09/04/2013 0953   HDL 49 09/04/2013 0953   CHOLHDL 3.3 09/04/2013 0953   VLDL 20 09/04/2013 0953   LDLCALC 91 09/04/2013 0953       Depression screen PHQ 2/9 09/04/2013  Decreased Interest 0  Down, Depressed, Hopeless 0  PHQ - 2 Score 0    Assessment and plan:   1. Essential hypertension On prinzide 20-25 qday DASH diet red  2. Depression w/ hx of MDD and PTSD per notes Currently on prozac 20 per her Psychiatrist  3. Prediabetes chk a1c 5.4 - was On metformin 500qday, given gi s/e, change to metformin xr 500 qd -low carb diet recd, increase exercise.  4. Smoking - tob cessation counseling given, recd complete cessation  5. Morbid obesity, unspecified obesity type ([place]) Increase exercise, calorie counting and food diary recd.   6. Health maintenance - due for papsmear - due for colonoscopy  - chk hep c and hiv screening - tdap today - pneumococcal 23 valient vaccine recd today  Return in about 3 weeks (around 02/28/2016) for pap.  The patient was given clear instructions to go to ER or return to medical center if symptoms don't improve, worsen or new problems develop. The patient verbalized understanding. The patient was told to call  to get lab results if they haven't heard anything in the next week.    This note has been created with Surveyor, quantity. Any transcriptional errors are unintentional.   Maren Reamer, MD, [place], [place]   02/07/2016, 2:07 PM

## 2016-02-07 NOTE — Addendum Note (Signed)
Addended by: Jackelyn Knife on: 02/07/2016 04:44 PM   Modules accepted: Orders

## 2016-02-08 LAB — HEPATITIS C ANTIBODY: HCV AB: NEGATIVE

## 2016-02-08 LAB — VITAMIN D 25 HYDROXY (VIT D DEFICIENCY, FRACTURES): Vit D, 25-Hydroxy: 28 ng/mL — ABNORMAL LOW (ref 30–100)

## 2016-02-08 LAB — MICROALBUMIN / CREATININE URINE RATIO
Creatinine, Urine: 198 mg/dL (ref 20–320)
MICROALB/CREAT RATIO: 5 ug/mg{creat} (ref <30)
Microalb, Ur: 1 mg/dL

## 2016-02-08 LAB — HIV ANTIBODY (ROUTINE TESTING W REFLEX): HIV: NONREACTIVE

## 2016-02-09 ENCOUNTER — Telehealth: Payer: Self-pay

## 2016-02-09 MED FILL — FLUoxetine HCL 20 MG CAPS: 20 | 30 days supply | Qty: 60 | Fill #1

## 2016-02-09 MED FILL — LISINOPRIL-HCTZ 20-12.5 MG: 20-12.5 | 30 days supply | Qty: 30 | Fill #0

## 2016-02-09 MED FILL — hydrOXYzine HCL 50 MG TABS: 50 | 30 days supply | Qty: 30 | Fill #1

## 2016-02-09 MED FILL — METFORMIN HCL ER 500 MG TAB: 500 | 30 days supply | Qty: 30 | Fill #0

## 2016-02-09 NOTE — Telephone Encounter (Signed)
Pt returned call to go over lab results pt is aware of results and doesn't have any questions or concerns  

## 2016-02-09 NOTE — Telephone Encounter (Signed)
Contacted pt to go over lab results pt did not answer lvm for pt to give me a call back at her earliest convenience  

## 2016-02-11 MED FILL — PERPHENAZINE 4 MG TABLET: 4 | 30 days supply | Qty: 60 | Fill #1

## 2016-02-28 ENCOUNTER — Encounter (HOSPITAL_COMMUNITY): Payer: Self-pay | Admitting: Student-PharmD

## 2016-03-10 MED FILL — PERPHENAZINE 4 MG TABLET: 4 | 30 days supply | Qty: 60 | Fill #2

## 2016-03-10 MED FILL — LISINOPRIL-HCTZ 20-12.5 MG: 20-12.5 | 30 days supply | Qty: 30 | Fill #1

## 2016-03-10 MED FILL — hydrOXYzine HCL 50 MG TABS: 50 | 30 days supply | Qty: 30 | Fill #2

## 2016-03-10 MED FILL — METFORMIN HCL ER 500 MG TAB: 500 | 30 days supply | Qty: 30 | Fill #1

## 2016-03-10 MED FILL — FLUoxetine HCL 20 MG CAPS: 20 | 30 days supply | Qty: 60 | Fill #2

## 2016-04-12 MED FILL — hydrOXYzine HCL 50 MG TABS: 50 | 30 days supply | Qty: 30 | Fill #3

## 2016-04-12 MED FILL — METFORMIN HCL ER 500 MG TAB: 500 | 30 days supply | Qty: 30 | Fill #2

## 2016-04-12 MED FILL — PERPHENAZINE 4 MG TABLET: 4 | 30 days supply | Qty: 60 | Fill #3

## 2016-04-12 MED FILL — LISINOPRIL-HCTZ 20-12.5 MG: 20-12.5 | 30 days supply | Qty: 30 | Fill #2

## 2016-04-12 MED FILL — FLUoxetine HCL 20 MG CAPS: 20 | 30 days supply | Qty: 60 | Fill #3

## 2016-05-24 MED FILL — METFORMIN HCL ER 500 MG TAB: 500 | 30 days supply | Qty: 30 | Fill #3

## 2016-05-24 MED FILL — PERPHENAZINE 4 MG TABLET: 4 | 30 days supply | Qty: 30 | Fill #0

## 2016-05-24 MED FILL — LISINOPRIL-HCTZ 20-12.5 MG: 20-12.5 | 30 days supply | Qty: 30 | Fill #3

## 2016-05-24 MED FILL — FLUoxetine HCL 20 MG CAPS: 20 | 30 days supply | Qty: 60 | Fill #0

## 2016-06-28 MED FILL — PERPHENAZINE 4 MG TABLET: 4 | 30 days supply | Qty: 30 | Fill #1

## 2016-06-28 MED FILL — METFORMIN HCL ER 500 MG TAB: 500 | 30 days supply | Qty: 30 | Fill #4

## 2016-06-28 MED FILL — FLUoxetine HCL 20 MG CAPS: 20 | 30 days supply | Qty: 60 | Fill #1

## 2016-06-28 MED FILL — LISINOPRIL-HCTZ 20-12.5 MG: 20-12.5 | 30 days supply | Qty: 30 | Fill #4

## 2016-07-27 MED FILL — METFORMIN HCL ER 500 MG TAB: 500 | 30 days supply | Qty: 30 | Fill #5

## 2016-07-27 MED FILL — LISINOPRIL-HCTZ 20-12.5 MG: 20-12.5 | 30 days supply | Qty: 30 | Fill #5

## 2016-07-27 MED FILL — PERPHENAZINE 4 MG TABLET: 4 | 30 days supply | Qty: 30 | Fill #2

## 2016-07-27 MED FILL — FLUoxetine HCL 20 MG CAPS: 20 | 30 days supply | Qty: 60 | Fill #2

## 2016-08-14 ENCOUNTER — Ambulatory Visit: Payer: Self-pay | Attending: Internal Medicine | Admitting: Physician Assistant

## 2016-08-14 VITALS — BP 132/80 | HR 83 | Temp 98.2°F | Resp 16 | Wt 276.6 lb

## 2016-08-14 DIAGNOSIS — F329 Major depressive disorder, single episode, unspecified: Secondary | ICD-10-CM | POA: Insufficient documentation

## 2016-08-14 DIAGNOSIS — J069 Acute upper respiratory infection, unspecified: Secondary | ICD-10-CM | POA: Insufficient documentation

## 2016-08-14 DIAGNOSIS — F17200 Nicotine dependence, unspecified, uncomplicated: Secondary | ICD-10-CM

## 2016-08-14 DIAGNOSIS — F418 Other specified anxiety disorders: Secondary | ICD-10-CM

## 2016-08-14 DIAGNOSIS — I10 Essential (primary) hypertension: Secondary | ICD-10-CM | POA: Insufficient documentation

## 2016-08-14 DIAGNOSIS — F419 Anxiety disorder, unspecified: Secondary | ICD-10-CM | POA: Insufficient documentation

## 2016-08-14 DIAGNOSIS — E119 Type 2 diabetes mellitus without complications: Secondary | ICD-10-CM | POA: Insufficient documentation

## 2016-08-14 DIAGNOSIS — E089 Diabetes mellitus due to underlying condition without complications: Secondary | ICD-10-CM

## 2016-08-14 DIAGNOSIS — R45851 Suicidal ideations: Secondary | ICD-10-CM | POA: Insufficient documentation

## 2016-08-14 DIAGNOSIS — F32A Depression, unspecified: Secondary | ICD-10-CM

## 2016-08-14 DIAGNOSIS — Z7984 Long term (current) use of oral hypoglycemic drugs: Secondary | ICD-10-CM | POA: Insufficient documentation

## 2016-08-14 DIAGNOSIS — Z79899 Other long term (current) drug therapy: Secondary | ICD-10-CM | POA: Insufficient documentation

## 2016-08-14 DIAGNOSIS — Z87891 Personal history of nicotine dependence: Secondary | ICD-10-CM | POA: Insufficient documentation

## 2016-08-14 LAB — GLUCOSE, POCT (MANUAL RESULT ENTRY): POC Glucose: 139 mg/dl — AB (ref 70–99)

## 2016-08-14 MED ORDER — AZITHROMYCIN 250 MG PO TABS: 250.0000 mg | ORAL_TABLET | Freq: Every day | ORAL | 0 refills | Status: DC

## 2016-08-14 MED ORDER — MOMETASONE FURO-FORMOTEROL FUM 200-5 MCG/ACT IN AERO: 2.0000 | INHALATION_SPRAY | Freq: Two times a day (BID) | RESPIRATORY_TRACT | 0 refills | Status: DC

## 2016-08-14 MED ORDER — FLUTICASONE PROPIONATE 50 MCG/ACT NA SUSP: 1.0000 | Freq: Every day | NASAL | 5 refills | Status: AC | PRN

## 2016-08-14 MED ORDER — FLUCONAZOLE 150 MG PO TABS: 150.0000 mg | ORAL_TABLET | Freq: Once | ORAL | 0 refills | Status: AC

## 2016-08-14 MED ORDER — BENZONATATE 200 MG PO CAPS: 200.0000 mg | ORAL_CAPSULE | Freq: Three times a day (TID) | ORAL | 0 refills | Status: DC | PRN

## 2016-08-14 MED FILL — BENZONATATE 100 MG CAPSULE: 100 | 6 days supply | Qty: 40 | Fill #0

## 2016-08-14 MED FILL — DULERA 200 MCG/5 MCG INH: 200-5 | 30 days supply | Qty: 13 | Fill #0

## 2016-08-14 MED FILL — FLUCONAZOLE 150 MG TABLET: 150 | 1 days supply | Qty: 1 | Fill #0

## 2016-08-14 MED FILL — AZITHROMYCIN 250 MG TABLET: 250 | 5 days supply | Qty: 6 | Fill #0

## 2016-08-14 NOTE — Patient Instructions (Signed)
Address: [address],  [address] [facility] Hours:  Open ? Closes 5PM  President's Day might affect these hours                       Phone: [phone]  [facility] Phone: [phone]

## 2016-08-14 NOTE — Progress Notes (Signed)
[name], is a 51 y.o. female  [id number]  [id number]  DOB - 15-Jan-1966  Subjective:  Chief Complaint and HPI: [name] is a 51 y.o. female here today for URI of > 1 week.  Cough is productive of yellow sputum.  No f/c.  She is experiencing nasal congestion but no sinus pain.  +sinus pressure.  Has taken vitamin C and OTCs with minimal relief.  Hasn't smoked in 9 days.  Stable on metformin.  No hyper/hypoglycemia.    +anxiety and depression-receives psychiatric care with Dr. Hoyle Barr.  Exacerbation of anxiety and depression currently due to her mom's poor and declining health and no support in making care decisions.  Little support.  She has passive SI without any plan or intent.  Says she would never harm herself despite previous hospitalizations.  She has attempted in the past and had prior hospitalizations and no longer has a desire to end her life.  No weapons in the home.  She is open to receiving more information and support.   Social History:  Lives alone currently mom is in assisted living currently.  Brother is POA and not helping make decisions regarding care, HOH wears a hearing aid  Depression screen [facility] 2/9 02/07/2016 09/04/2013  Decreased Interest 3 0  Down, Depressed, Hopeless 3 0  PHQ - 2 Score 6 0  Altered sleeping 3 -  Tired, decreased energy 3 -  Change in appetite 3 -  Feeling bad or failure about yourself  3 -  Trouble concentrating 2 -  Moving slowly or fidgety/restless 0 -  Suicidal thoughts 0 -  PHQ-9 Score 20 -    ROS:   Constitutional:  No f/c, No night sweats, No unexplained weight loss. EENT:  No vision changes, No blurry vision, No hearing changes(wears hearing aids). No mouth, throat, or ear problems. +sinus congestion Respiratory: +cough, +mild wheezing and SOB Cardiac: No CP, no palpitations GI:  No abd pain, No N/V/D. GU: No Urinary s/sx Musculoskeletal: No joint pain Neuro: No headache, no dizziness, no motor weakness.    Skin: No rash Endocrine:  No polydipsia. No polyuria.  Psych: Denies SI/HI  No problems updated.  ALLERGIES: Allergies  Allergen Reactions  . Latex Rash    PAST MEDICAL HISTORY: Past Medical History:  Diagnosis Date  . Depression   . Diabetes mellitus   . Hypertension   . Obesity   . PTSD (post-traumatic stress disorder)     MEDICATIONS AT HOME: Prior to Admission medications   Medication Sig Start Date End Date Taking? Authorizing Provider  azithromycin (ZITHROMAX) 250 MG tablet Take 1 tablet (250 mg total) by mouth daily. Take first 2 tablets together, then 1 every day until finished. 08/14/16   Argentina Donovan, PA-C  benzonatate (TESSALON) 200 MG capsule Take 1 capsule (200 mg total) by mouth 3 (three) times daily as needed for cough. 08/14/16   Argentina Donovan, PA-C  cholecalciferol (VITAMIN D) 1000 UNITS tablet Take 1,000 Units by mouth daily.    Historical Provider, MD  fluconazole (DIFLUCAN) 150 MG tablet Take 1 tablet (150 mg total) by mouth once. 08/14/16 08/14/16  Argentina Donovan, PA-C  FLUoxetine (PROZAC) 20 MG tablet Take 20 mg by mouth daily.    Historical Provider, MD  fluticasone (FLONASE) 50 MCG/ACT nasal spray Place 1 spray into both nostrils daily as needed for allergies or rhinitis. 08/14/16   Argentina Donovan, PA-C  lisinopril-hydrochlorothiazide (ZESTORETIC) 20-12.5 MG tablet Take 1 tablet by mouth daily.  02/07/16   Maren Reamer, MD  metFORMIN (GLUCOPHAGE XR) 500 MG 24 hr tablet Take 1 tablet (500 mg total) by mouth daily with breakfast. 02/07/16   Maren Reamer, MD  mometasone-formoterol (DULERA) 200-5 MCG/ACT AERO Inhale 2 puffs into the lungs 2 (two) times daily. 08/14/16   Argentina Donovan, PA-C  perphenazine (TRILAFON) 8 MG tablet Take 1 tablet (8 mg total) by mouth at bedtime. Patient not taking: Reported on 02/07/2016 06/22/14   Lance Bosch, NP  Terbinafine (LAMISIL ADVANCED) 1 % GEL May apply twice daily 07/07/15   Lance Bosch, NP      Objective:  EXAM:   Vitals:   08/14/16 0956  BP: 132/80  Pulse: 83  Resp: 16  Temp: 98.2 F (36.8 C)  TempSrc: Oral  SpO2: 97%  Weight: 276 lb 9.6 oz (125.5 kg)    General appearance : A&OX3. NAD. Non-toxic-appearing HEENT: Atraumatic and Normocephalic.  PERRLA. EOM intact.  (removed hearing aids)TM clear B. Mouth-MMM, post pharynx WNL w/o erythema, No PND. Neck: supple, no JVD. No cervical lymphadenopathy. No thyromegaly Chest/Lungs:  Breathing-non-labored, Good air entry bilaterally, mild wheezing throughout without rales or rhonchi.   CVS: S1 S2 regular, no murmurs, gallops, rubs Extremities: Bilateral Lower Ext shows no edema, both legs are warm to touch with = pulse throughout Neurology:  CN II-XII grossly intact, Non focal.   Psych:  TP linear. J/I WNL. Normal speech. Appropriate eye contact and affect.  Skin:  No Rash  Data Review Lab Results  Component Value Date   HGBA1C 5.4 02/07/2016   HGBA1C 5.80 07/07/2015   HGBA1C 5.5 06/22/2014     Assessment & Plan   1. Diabetes mellitus due to underlying condition without complication, without long-term current use of insulin (HCC) Uncontrolled with elevated glucose today.  Work hard on food choices/decrease sugar/increase water intake - POCT glucose (manual entry)  2. Upper respiratory tract infection, unspecified type - mometasone-formoterol (DULERA) 200-5 MCG/ACT AERO; Inhale 2 puffs into the lungs 2 (two) times daily.  Dispense: 1 Inhaler; Refill: 0 - benzonatate (TESSALON) 200 MG capsule; Take 1 capsule (200 mg total) by mouth 3 (three) times daily as needed for cough.  Dispense: 20 capsule; Refill: 0 - fluconazole (DIFLUCAN) 150 MG tablet; Take 1 tablet (150 mg total) by mouth once.  Dispense: 1 tablet; Refill: 0 - fluticasone (FLONASE) 50 MCG/ACT nasal spray; Place 1 spray into both nostrils daily as needed for allergies or rhinitis.  Dispense: 16 g; Refill: 5 - azithromycin (ZITHROMAX) 250 MG tablet; Take  1 tablet (250 mg total) by mouth daily. Take first 2 tablets together, then 1 every day until finished.  Dispense: 6 tablet; Refill: 0  3. Anxiety and depression Continue f/up with Dr. Hoyle Barr.  I also believe she could benefit from counseling.  Gave info for [facility] and [facility] due to her dealing with illness/potential end of life issues with her mom.  There are no SI/HI or acute safety issues today.  I will have our social worker, Christa See follow up with her by phone.  4. Smoker Congratulated on 9 days cessation so far! 1-800-quitnow and 12 step programs/websites for cessation given and counseled at length on benefits of cessation.  Patient have been counseled extensively about nutrition and exercise  Return in about 2 weeks (around 08/28/2016) for DrLangeland; blood sugar/bloodwork/f/up anxiety and depression.  The patient was given clear instructions to go to ER or return to medical center if symptoms don't improve, worsen or  new problems develop. The patient verbalized understanding. The patient was told to call to get lab results if they haven't heard anything in the next week.     Freeman Caldron, PA-C [facility] [place], [place]   08/14/2016, 10:32 AMPatient ID: Sonnie Alamo, female   DOB: February 25, 1966, 51 y.o.   MRN: [id number]

## 2016-08-16 MED FILL — FLUoxetine HCL 40 MG CAPS: 40 | 30 days supply | Qty: 30 | Fill #0

## 2016-08-16 MED FILL — PERPHENAZINE 2 MG TAB: 2 | 30 days supply | Qty: 60 | Fill #0

## 2016-08-16 MED FILL — traZODone HCL 100 MG TABS: 100 | 30 days supply | Qty: 30 | Fill #0

## 2016-08-23 ENCOUNTER — Ambulatory Visit: Payer: Self-pay | Attending: Internal Medicine

## 2016-08-28 ENCOUNTER — Other Ambulatory Visit: Payer: Self-pay | Admitting: *Deleted

## 2016-08-28 DIAGNOSIS — J069 Acute upper respiratory infection, unspecified: Secondary | ICD-10-CM

## 2016-08-28 MED ORDER — MOMETASONE FURO-FORMOTEROL FUM 200-5 MCG/ACT IN AERO: 2.0000 | INHALATION_SPRAY | Freq: Two times a day (BID) | RESPIRATORY_TRACT | 3 refills | Status: AC

## 2016-08-28 MED FILL — METFORMIN HCL ER 500 MG TAB: 500 | 30 days supply | Qty: 30 | Fill #6

## 2016-08-28 MED FILL — LISINOPRIL-HCTZ 20-12.5 MG: 20-12.5 | 30 days supply | Qty: 30 | Fill #6

## 2016-08-28 NOTE — Telephone Encounter (Signed)
PRINTED FOR PASS PROGRAM 

## 2016-09-04 ENCOUNTER — Ambulatory Visit: Payer: Self-pay | Admitting: Internal Medicine

## 2016-09-11 ENCOUNTER — Ambulatory Visit: Payer: Self-pay | Attending: Internal Medicine | Admitting: Internal Medicine

## 2016-09-11 VITALS — BP 114/78 | HR 80 | Temp 98.1°F | Resp 16 | Wt 287.6 lb

## 2016-09-11 DIAGNOSIS — Z79899 Other long term (current) drug therapy: Secondary | ICD-10-CM | POA: Insufficient documentation

## 2016-09-11 DIAGNOSIS — E089 Diabetes mellitus due to underlying condition without complications: Secondary | ICD-10-CM

## 2016-09-11 DIAGNOSIS — F339 Major depressive disorder, recurrent, unspecified: Secondary | ICD-10-CM | POA: Insufficient documentation

## 2016-09-11 DIAGNOSIS — Z1329 Encounter for screening for other suspected endocrine disorder: Secondary | ICD-10-CM | POA: Insufficient documentation

## 2016-09-11 DIAGNOSIS — Z1231 Encounter for screening mammogram for malignant neoplasm of breast: Secondary | ICD-10-CM

## 2016-09-11 DIAGNOSIS — I10 Essential (primary) hypertension: Secondary | ICD-10-CM | POA: Insufficient documentation

## 2016-09-11 DIAGNOSIS — Z7984 Long term (current) use of oral hypoglycemic drugs: Secondary | ICD-10-CM | POA: Insufficient documentation

## 2016-09-11 DIAGNOSIS — E119 Type 2 diabetes mellitus without complications: Secondary | ICD-10-CM | POA: Insufficient documentation

## 2016-09-11 DIAGNOSIS — E559 Vitamin D deficiency, unspecified: Secondary | ICD-10-CM | POA: Insufficient documentation

## 2016-09-11 DIAGNOSIS — E2839 Other primary ovarian failure: Secondary | ICD-10-CM | POA: Insufficient documentation

## 2016-09-11 DIAGNOSIS — R05 Cough: Secondary | ICD-10-CM | POA: Insufficient documentation

## 2016-09-11 DIAGNOSIS — Z1239 Encounter for other screening for malignant neoplasm of breast: Secondary | ICD-10-CM

## 2016-09-11 LAB — CBC WITH DIFFERENTIAL/PLATELET
BASOS ABS: 0 {cells}/uL (ref 0–200)
Basophils Relative: 0 %
Eosinophils Absolute: 315 cells/uL (ref 15–500)
Eosinophils Relative: 5 %
HCT: 36.6 % (ref 35.0–45.0)
HEMOGLOBIN: 12 g/dL (ref 11.7–15.5)
LYMPHS PCT: 28 %
Lymphs Abs: 1764 cells/uL (ref 850–3900)
MCH: 29.3 pg (ref 27.0–33.0)
MCHC: 32.8 g/dL (ref 32.0–36.0)
MCV: 89.5 fL (ref 80.0–100.0)
MPV: 9.2 fL (ref 7.5–12.5)
Monocytes Absolute: 567 cells/uL (ref 200–950)
Monocytes Relative: 9 %
Neutro Abs: 3654 cells/uL (ref 1500–7800)
Neutrophils Relative %: 58 %
Platelets: 246 10*3/uL (ref 140–400)
RBC: 4.09 MIL/uL (ref 3.80–5.10)
RDW: 14.4 % (ref 11.0–15.0)
WBC: 6.3 10*3/uL (ref 3.8–10.8)

## 2016-09-11 LAB — POCT GLYCOSYLATED HEMOGLOBIN (HGB A1C): Hemoglobin A1C: 5.5

## 2016-09-11 LAB — CMP AND LIVER
ALBUMIN: 3.9 g/dL (ref 3.6–5.1)
ALT: 18 U/L (ref 6–29)
AST: 15 U/L (ref 10–35)
Alkaline Phosphatase: 74 U/L (ref 33–130)
BUN: 12 mg/dL (ref 7–25)
Bilirubin, Direct: 0.2 mg/dL (ref <=0.2)
CO2: 23 mmol/L (ref 20–31)
Calcium: 9.4 mg/dL (ref 8.6–10.4)
Chloride: 106 mmol/L (ref 98–110)
Creat: 0.67 mg/dL (ref 0.50–1.05)
GLUCOSE: 101 mg/dL — AB (ref 65–99)
Indirect Bilirubin: 0.8 mg/dL (ref 0.2–1.2)
Potassium: 4.5 mmol/L (ref 3.5–5.3)
SODIUM: 139 mmol/L (ref 135–146)
Total Bilirubin: 1 mg/dL (ref 0.2–1.2)
Total Protein: 6.1 g/dL (ref 6.1–8.1)

## 2016-09-11 LAB — GLUCOSE, POCT (MANUAL RESULT ENTRY): POC Glucose: 111 mg/dl — AB (ref 70–99)

## 2016-09-11 LAB — TSH: TSH: 4.13 mIU/L

## 2016-09-11 MED ORDER — LISINOPRIL-HYDROCHLOROTHIAZIDE 20-12.5 MG PO TABS: 1.0000 | ORAL_TABLET | Freq: Every day | ORAL | 3 refills | Status: DC

## 2016-09-11 MED ORDER — METFORMIN HCL ER 500 MG PO TB24: 500.0000 mg | ORAL_TABLET | Freq: Every day | ORAL | 3 refills | Status: DC

## 2016-09-11 NOTE — Patient Instructions (Addendum)
Calcium 1,200mg  /daily - bone health Vit d - ??   - Activia yogurt - 2 x day if bad diarrhea, 1 x day if better, good probiotic source -   For your tobacco abuse: Strongly recommend that he stop smoking back and all other inhaled products right away Call 1-800-Quit-Now to get free nicotine replacement from the state of [place]  -   Steps to Quit Smoking Smoking tobacco can be bad for your health. It can also affect almost every organ in your body. Smoking puts you and people around you at risk for many serious long-lasting (chronic) diseases. Quitting smoking is hard, but it is one of the best things that you can do for your health. It is never too late to quit. What are the benefits of quitting smoking? When you quit smoking, you lower your risk for getting serious diseases and conditions. They can include:  Lung cancer or lung disease.  Heart disease.  Stroke.  Heart attack.  Not being able to have children (infertility).  Weak bones (osteoporosis) and broken bones (fractures). If you have coughing, wheezing, and shortness of breath, those symptoms may get better when you quit. You may also get sick less often. If you are pregnant, quitting smoking can help to lower your chances of having a baby of low birth weight. What can I do to help me quit smoking? Talk with your doctor about what can help you quit smoking. Some things you can do (strategies) include:  Quitting smoking totally, instead of slowly cutting back how much you smoke over a period of time.  Going to in-person counseling. You are more likely to quit if you go to many counseling sessions.  Using resources and support systems, such as:  Online chats with a Social worker.  Phone quitlines.  Printed Furniture conservator/restorer.  Support groups or group counseling.  Text messaging programs.  Mobile phone apps or applications.  Taking medicines. Some of these medicines may have nicotine in them. If you are pregnant or  breastfeeding, do not take any medicines to quit smoking unless your doctor says it is okay. Talk with your doctor about counseling or other things that can help you. Talk with your doctor about using more than one strategy at the same time, such as taking medicines while you are also going to in-person counseling. This can help make quitting easier. What things can I do to make it easier to quit? Quitting smoking might feel very hard at first, but there is a lot that you can do to make it easier. Take these steps:  Talk to your family and friends. Ask them to support and encourage you.  Call phone quitlines, reach out to support groups, or work with a Social worker.  Ask people who smoke to not smoke around you.  Avoid places that make you want (trigger) to smoke, such as:  Bars.  Parties.  Smoke-break areas at work.  Spend time with people who do not smoke.  Lower the stress in your life. Stress can make you want to smoke. Try these things to help your stress:  Getting regular exercise.  Deep-breathing exercises.  Yoga.  Meditating.  Doing a body scan. To do this, close your eyes, focus on one area of your body at a time from head to toe, and notice which parts of your body are tense. Try to relax the muscles in those areas.  Download or buy apps on your mobile phone or tablet that can help you stick  to your quit plan. There are many free apps, such as QuitGuide from the [employer] Office manager for Disease Control and Prevention). You can find more support from smokefree.gov and other websites. This information is not intended to replace advice given to you by your health care provider. Make sure you discuss any questions you have with your health care provider. Document Released: 04/08/2009 Document Revised: 02/08/2016 Document Reviewed: 10/27/2014 Elsevier Interactive Patient Education  2017 [place].   -  Insomnia Insomnia is a sleep disorder that makes it difficult to fall  asleep or to stay asleep. Insomnia can cause tiredness (fatigue), low energy, difficulty concentrating, mood swings, and poor performance at work or school. There are three different ways to classify insomnia: Difficulty falling asleep. Difficulty staying asleep. Waking up too early in the morning. Any type of insomnia can be long-term (chronic) or short-term (acute). Both are common. Short-term insomnia usually lasts for three months or less. Chronic insomnia occurs at least three times a week for longer than three months. What are the causes? Insomnia may be caused by another condition, situation, or substance, such as: Anxiety. Certain medicines. Gastroesophageal reflux disease (GERD) or other gastrointestinal conditions. Asthma or other breathing conditions. Restless legs syndrome, sleep apnea, or other sleep disorders. Chronic pain. Menopause. This may include hot flashes. Stroke. Abuse of alcohol, tobacco, or illegal drugs. Depression. Caffeine. Neurological disorders, such as Alzheimer disease. An overactive thyroid (hyperthyroidism). The cause of insomnia may not be known. What increases the risk? Risk factors for insomnia include: Gender. Women are more commonly affected than men. Age. Insomnia is more common as you get older. Stress. This may involve your professional or personal life. Income. Insomnia is more common in people with lower income. Lack of exercise. Irregular work schedule or night shifts. Traveling between different time zones. What are the signs or symptoms? If you have insomnia, trouble falling asleep or trouble staying asleep is the main symptom. This may lead to other symptoms, such as: Feeling fatigued. Feeling nervous about going to sleep. Not feeling rested in the morning. Having trouble concentrating. Feeling irritable, anxious, or depressed. How is this treated? Treatment for insomnia depends on the cause. If your insomnia is caused by an  underlying condition, treatment will focus on addressing the condition. Treatment may also include: Medicines to help you sleep. Counseling or therapy. Lifestyle adjustments. Follow these instructions at home: Take medicines only as directed by your health care provider. Keep regular sleeping and waking hours. Avoid naps. Keep a sleep diary to help you and your health care provider figure out what could be causing your insomnia. Include: When you sleep. When you wake up during the night. How well you sleep. How rested you feel the next day. Any side effects of medicines you are taking. What you eat and drink. Make your bedroom a comfortable place where it is easy to fall asleep: Put up shades or special blackout curtains to block light from outside. Use a white noise machine to block noise. Keep the temperature cool. Exercise regularly as directed by your health care provider. Avoid exercising right before bedtime. Use relaxation techniques to manage stress. Ask your health care provider to suggest some techniques that may work well for you. These may include: Breathing exercises. Routines to release muscle tension. Visualizing peaceful scenes. Cut back on alcohol, caffeinated beverages, and cigarettes, especially close to bedtime. These can disrupt your sleep. Do not overeat or eat spicy foods right before bedtime. This can lead to digestive discomfort  that can make it hard for you to sleep. Limit screen use before bedtime. This includes: Watching TV. Using your smartphone, tablet, and computer. Stick to a routine. This can help you fall asleep faster. Try to do a quiet activity, brush your teeth, and go to bed at the same time each night. Get out of bed if you are still awake after 15 minutes of trying to sleep. Keep the lights down, but try reading or doing a quiet activity. When you feel sleepy, go back to bed. Make sure that you drive carefully. Avoid driving if you feel very  sleepy. Keep all follow-up appointments as directed by your health care provider. This is important. Contact a health care provider if: You are tired throughout the day or have trouble in your daily routine due to sleepiness. You continue to have sleep problems or your sleep problems get worse. Get help right away if: You have serious thoughts about hurting yourself or someone else. This information is not intended to replace advice given to you by your health care provider. Make sure you discuss any questions you have with your health care provider. Document Released: 06/09/2000 Document Revised: 11/12/2015 Document Reviewed: 03/13/2014 Elsevier Interactive Patient Education  2017 Elsevier Inc.  -  Low-Sodium Eating Plan Sodium, which is an element that makes up salt, helps you maintain a healthy balance of fluids in your body. Too much sodium can increase your blood pressure and cause fluid and waste to be held in your body. Your health care provider or dietitian may recommend following this plan if you have high blood pressure (hypertension), kidney disease, liver disease, or heart failure. Eating less sodium can help lower your blood pressure, reduce swelling, and protect your heart, liver, and kidneys. What are tips for following this plan? General guidelines   Most people on this plan should limit their sodium intake to 1,500-2,000 mg (milligrams) of sodium each day. Reading food labels   The Nutrition Facts label lists the amount of sodium in one serving of the food. If you eat more than one serving, you must multiply the listed amount of sodium by the number of servings.  Choose foods with less than 140 mg of sodium per serving.  Avoid foods with 300 mg of sodium or more per serving. Shopping   Look for lower-sodium products, often labeled as "low-sodium" or "no salt added."  Always check the sodium content even if foods are labeled as "unsalted" or "no salt added".  Buy  fresh foods.  Avoid canned foods and premade or frozen meals.  Avoid canned, cured, or processed meats  Buy breads that have less than 80 mg of sodium per slice. Cooking   Eat more home-cooked food and less restaurant, buffet, and fast food.  Avoid adding salt when cooking. Use salt-free seasonings or herbs instead of table salt or sea salt. Check with your health care provider or pharmacist before using salt substitutes.  Cook with plant-based oils, such as canola, sunflower, or olive oil. Meal planning   When eating at a restaurant, ask that your food be prepared with less salt or no salt, if possible.  Avoid foods that contain MSG (monosodium glutamate). MSG is sometimes added to [place] food, bouillon, and some canned foods. What foods are recommended? The items listed may not be a complete list. Talk with your dietitian about what dietary choices are best for you. Grains  Low-sodium cereals, including oats, puffed wheat and rice, and shredded wheat. Low-sodium crackers. Unsalted  rice. Unsalted pasta. Low-sodium bread. Whole-grain breads and whole-grain pasta. Vegetables  Fresh or frozen vegetables. "No salt added" canned vegetables. "No salt added" tomato sauce and paste. Low-sodium or reduced-sodium tomato and vegetable juice. Fruits  Fresh, frozen, or canned fruit. Fruit juice. Meats and other protein foods  Fresh or frozen (no salt added) meat, poultry, seafood, and fish. Low-sodium canned tuna and salmon. Unsalted nuts. Dried peas, beans, and lentils without added salt. Unsalted canned beans. Eggs. Unsalted nut butters. Dairy  Milk. Soy milk. Cheese that is naturally low in sodium, such as ricotta cheese, fresh mozzarella, or Swiss cheese Low-sodium or reduced-sodium cheese. Cream cheese. Yogurt. Fats and oils  Unsalted butter. Unsalted margarine with no trans fat. Vegetable oils such as canola or olive oils. Seasonings and other foods  Fresh and dried herbs and spices.  Salt-free seasonings. Low-sodium mustard and ketchup. Sodium-free salad dressing. Sodium-free light mayonnaise. Fresh or refrigerated horseradish. Lemon juice. Vinegar. Homemade, reduced-sodium, or low-sodium soups. Unsalted popcorn and pretzels. Low-salt or salt-free chips. What foods are not recommended? The items listed may not be a complete list. Talk with your dietitian about what dietary choices are best for you. Grains  Instant hot cereals. Bread stuffing, pancake, and biscuit mixes. Croutons. Seasoned rice or pasta mixes. Noodle soup cups. Boxed or frozen macaroni and cheese. Regular salted crackers. Self-rising flour. Vegetables  Sauerkraut, pickled vegetables, and relishes. Olives. [place] fries. Onion rings. Regular canned vegetables (not low-sodium or reduced-sodium). Regular canned tomato sauce and paste (not low-sodium or reduced-sodium). Regular tomato and vegetable juice (not low-sodium or reduced-sodium). Frozen vegetables in sauces. Meats and other protein foods  Meat or fish that is salted, canned, smoked, spiced, or pickled. Bacon, ham, sausage, hotdogs, corned beef, chipped beef, packaged lunch meats, salt pork, jerky, pickled herring, anchovies, regular canned tuna, sardines, salted nuts. Dairy  Processed cheese and cheese spreads. Cheese curds. Blue cheese. Feta cheese. String cheese. Regular cottage cheese. Buttermilk. Canned milk. Fats and oils  Salted butter. Regular margarine. Ghee. Bacon fat. Seasonings and other foods  Onion salt, garlic salt, seasoned salt, table salt, and sea salt. Canned and packaged gravies. Worcestershire sauce. Tartar sauce. Barbecue sauce. Teriyaki sauce. Soy sauce, including reduced-sodium. Steak sauce. Fish sauce. Oyster sauce. Cocktail sauce. Horseradish that you find on the shelf. Regular ketchup and mustard. Meat flavorings and tenderizers. Bouillon cubes. Hot sauce and Tabasco sauce. Premade or packaged marinades. Premade or packaged taco  seasonings. Relishes. Regular salad dressings. Salsa. Potato and tortilla chips. Corn chips and puffs. Salted popcorn and pretzels. Canned or dried soups. Pizza. Frozen entrees and pot pies. Summary  Eating less sodium can help lower your blood pressure, reduce swelling, and protect your heart, liver, and kidneys.  Most people on this plan should limit their sodium intake to 1,500-2,000 mg (milligrams) of sodium each day.  Canned, boxed, and frozen foods are high in sodium. Restaurant foods, fast foods, and pizza are also very high in sodium. You also get sodium by adding salt to food.  Try to cook at home, eat more fresh fruits and vegetables, and eat less fast food, canned, processed, or prepared foods. This information is not intended to replace advice given to you by your health care provider. Make sure you discuss any questions you have with your health care provider. Document Released: 12/02/2001 Document Revised: 06/05/2016 Document Reviewed: 06/05/2016 Elsevier Interactive Patient Education  2017 [employer].

## 2016-09-11 NOTE — Progress Notes (Signed)
[name], is a 51 y.o. female  [id number]:[id number]  [id number]  DOB - [birth year]  Chief Complaint  Patient presents with  . Diabetes  . Anxiety  . Depression        Subjective:   [name] is a 51 y.o. female here today for a follow up visit, last seen in clinic 2/18 for uri. Per pt, resp status much better, still w/ intermittent dry cough. Denies f/c.  She has stopped smoking cold [place] x 39 days now.  She has hx of MDD, followed by [facility], w/ psychiatry qm and was seeing the therapist qwk, but that has since changed since they moved her therapist.  She is currently taking trazodone prn for sleep, prozac  - she thinks 40mg  dose, and "propranazine 4mg  daily" - spell? I suspect she is on Perphenazine instead?   Patient has No headache, No chest pain, No abdominal pain - No Nausea, No new weakness tingling or numbness, No Cough - SOB.  No problems updated.  ALLERGIES: Allergies  Allergen Reactions  . Latex Rash    PAST MEDICAL HISTORY: Past Medical History:  Diagnosis Date  . Depression   . Diabetes mellitus   . Hypertension   . Obesity   . PTSD (post-traumatic stress disorder)     MEDICATIONS AT HOME: Prior to Admission medications   Medication Sig Start Date End Date Taking? Authorizing Provider  benzonatate (TESSALON) 200 MG capsule Take 1 capsule (200 mg total) by mouth 3 (three) times daily as needed for cough. Patient not taking: Reported on 09/11/2016 08/14/16   Argentina Donovan, PA-C  cholecalciferol (VITAMIN D) 1000 UNITS tablet Take 1,000 Units by mouth daily.    Historical Provider, MD  FLUoxetine (PROZAC) 20 MG tablet Take 20 mg by mouth daily.    Historical Provider, MD  fluticasone (FLONASE) 50 MCG/ACT nasal spray Place 1 spray into both nostrils daily as needed for allergies or rhinitis. 08/14/16   Argentina Donovan, PA-C  lisinopril-hydrochlorothiazide (ZESTORETIC) 20-12.5 MG tablet Take 1 tablet by mouth daily. 09/11/16   Maren Reamer, MD  metFORMIN (GLUCOPHAGE XR) 500 MG 24 hr tablet Take 1 tablet (500 mg total) by mouth daily with breakfast. 09/11/16   Maren Reamer, MD  mometasone-formoterol (DULERA) 200-5 MCG/ACT AERO Inhale 2 puffs into the lungs 2 (two) times daily. 08/28/16   Tresa Garter, MD  perphenazine (TRILAFON) 8 MG tablet Take 1 tablet (8 mg total) by mouth at bedtime. Patient not taking: Reported on 02/07/2016 06/22/14   Lance Bosch, NP  Terbinafine (LAMISIL ADVANCED) 1 % GEL May apply twice daily 07/07/15   Lance Bosch, NP     Objective:   Vitals:   09/11/16 1435  BP: 114/78  Pulse: 80  Resp: 16  Temp: 98.1 F (36.7 C)  TempSrc: Oral  SpO2: 95%  Weight: 287 lb 9.6 oz (130.5 kg)    Exam General appearance : Awake, alert, not in any distress. Speech Clear. Not toxic looking, morbid obese, normal affect, good spirits. HEENT: Atraumatic and Normocephalic, pupils equally reactive to light. Neck: supple, no JVD.  Chest:Good air entry bilaterally, no added sounds. CVS: S1 S2 regular, no murmurs/gallups or rubs. Abdomen: Bowel sounds active, Non tender and not distended with no gaurding, rigidity or rebound. Foot exam: bilateral peripheral pulses 2+ (dorsalis pedis and post tibialis pulses), no ulcers noted/no ecchymosis, warm to touch, monofilament testing 3/3 bilat. Sensation intact.  No c/c/e. Neurology: Awake alert, and oriented X  3, CN II-XII grossly intact, Non focal Skin:No Rash  Data Review Lab Results  Component Value Date   HGBA1C 5.5 09/11/2016   HGBA1C 5.4 02/07/2016   HGBA1C 5.80 07/07/2015    Depression screen PHQ 2/9 09/11/2016 08/14/2016 02/07/2016 09/04/2013  Decreased Interest 3 3 3  0  Down, Depressed, Hopeless 3 3 3  0  PHQ - 2 Score 6 6 6  0  Altered sleeping 3 2 3  -  Tired, decreased energy 3 3 3  -  Change in appetite 3 3 3  -  Feeling bad or failure about yourself  3 3 3  -  Trouble concentrating 2 2 2  -  Moving slowly or fidgety/restless 1 1 0 -    Suicidal thoughts 0 1 0 -  PHQ-9 Score 21 21 20  -      Assessment & Plan   1. Essential hypertension Well controlled, continue low salt diet, - renewed  Prinzide 20-12.5 qd - CBC with Differential - CMP and Liver  2. Episode of recurrent major depressive disorder, unspecified depression episode severity ([place]) Defer to psychiatry all rx. [facility] therapist per pt. - Jasmine sw to see pt today as well. - pt denies si/hi/avh today.  3. Thyroid disorder screen - TSH  4. Breast cancer screening - scholarship form provided - MM Digital Screening; Future  5. Estrogen deficiency - no menses x 13 months, suspect menopausal - FSH/LH  6. Vitamin D deficiency - needs calcium 1200mg  /daily - VITAMIN D 25 Hydroxy (Vit-D Deficiency, Fractures)  - will chk levels   7. Diabetes mellitus due to underlying condition without complication, without long-term current use of insulin ([place]) - very well controlled, tol metformin, hx of prediabetes as well. - POCT glycosylated hemoglobin (Hb A1C) 5.5 - POCT glucose (manual entry) 111 - continue metformin 500qd - recd eye exam, uninsured currently.  8. Needs colonoscopy - currently uninsured  9. Financial aid recd  10. intermittent c/o of loose stools, denies melena/hematochezia - recd probiotics, such as activia bid acutely   Patient have been counseled extensively about nutrition and exercise  Return in about 4 weeks (around 10/09/2016), or if symptoms worsen or fail to improve, for pap smear.  The patient was given clear instructions to go to ER or return to medical center if symptoms don't improve, worsen or new problems develop. The patient verbalized understanding. The patient was told to call to get lab results if they haven't heard anything in the next week.   This note has been created with Surveyor, quantity. Any transcriptional errors are unintentional.   Maren Reamer, MD, [facility], [place]   09/11/2016, 2:44 PM

## 2016-09-12 LAB — FSH/LH
FSH: 68 m[IU]/mL
LH: 56 m[IU]/mL

## 2016-09-12 LAB — VITAMIN D 25 HYDROXY (VIT D DEFICIENCY, FRACTURES): Vit D, 25-Hydroxy: 37 ng/mL (ref 30–100)

## 2016-09-20 MED FILL — METFORMIN HCL ER 500 MG TAB: 500 | 30 days supply | Qty: 30 | Fill #7

## 2016-09-20 MED FILL — LISINOPRIL-HCTZ 20-12.5 MG: 20-12.5 | 30 days supply | Qty: 30 | Fill #7

## 2016-09-20 MED FILL — PERPHENAZINE 4 MG TABLET: 4 | 30 days supply | Qty: 30 | Fill #0

## 2016-09-21 ENCOUNTER — Telehealth: Payer: Self-pay

## 2016-09-21 MED FILL — FLUoxetine HCL 40 MG CAPS: 40 | 30 days supply | Qty: 30 | Fill #0

## 2016-09-21 MED FILL — traZODone HCL 150 MG TABS: 150 | 15 days supply | Qty: 30 | Fill #0

## 2016-09-21 NOTE — Telephone Encounter (Signed)
Contacted pt to go over lab results pt didn't answer lvm on house number to give me a call at her earliest convenience   If pt calls back please give results: Vit d better, take otc vitamin d 800 IU daily, Calcium 1200mg  /daily for bone health, need both. Labs point towards menopause as well, so definitely need vit d and calcium to prevent bone loss. Kidney, liver, thyroid, and blood Count all normal

## 2016-10-17 MED FILL — traZODone HCL 150 MG TABS: 150 | 15 days supply | Qty: 30 | Fill #1

## 2016-10-17 MED FILL — FLUoxetine HCL 40 MG CAPS: 40 | 30 days supply | Qty: 30 | Fill #1

## 2016-10-17 MED FILL — METFORMIN HCL ER 500 MG TAB: 500 | 30 days supply | Qty: 30 | Fill #8

## 2016-10-19 MED FILL — PERPHENAZINE 2 MG TAB: 2 | 30 days supply | Qty: 60 | Fill #0

## 2016-10-19 MED FILL — LISINOPRIL-HCTZ 20-12.5 MG: 20-12.5 | 30 days supply | Qty: 30 | Fill #8

## 2016-10-20 ENCOUNTER — Other Ambulatory Visit: Payer: Self-pay | Admitting: Obstetrics and Gynecology

## 2016-10-20 DIAGNOSIS — Z1231 Encounter for screening mammogram for malignant neoplasm of breast: Secondary | ICD-10-CM

## 2016-11-01 MED FILL — PERPHENAZINE 4 MG TABLET: 4 | 30 days supply | Qty: 30 | Fill #0

## 2016-11-01 MED FILL — traZODone HCL 150 MG TABS: 150 | 15 days supply | Qty: 30 | Fill #0

## 2016-11-09 ENCOUNTER — Ambulatory Visit (HOSPITAL_COMMUNITY)
Admission: RE | Admit: 2016-11-09 | Discharge: 2016-11-09 | Disposition: A | Discharge status: Home / Self Care | Payer: Self-pay | Source: Ambulatory Visit | Attending: Obstetrics and Gynecology | Admitting: Obstetrics and Gynecology

## 2016-11-09 ENCOUNTER — Ambulatory Visit
Admission: RE | Admit: 2016-11-09 | Discharge: 2016-11-09 | Disposition: A | Discharge status: Home / Self Care | Payer: Self-pay | Source: Ambulatory Visit | Attending: Obstetrics and Gynecology | Admitting: Obstetrics and Gynecology

## 2016-11-09 ENCOUNTER — Encounter: Payer: Self-pay | Admitting: Internal Medicine

## 2016-11-09 ENCOUNTER — Encounter (HOSPITAL_COMMUNITY): Payer: Self-pay

## 2016-11-09 VITALS — BP 112/74 | Temp 97.8°F | Ht 68.0 in | Wt 274.2 lb

## 2016-11-09 DIAGNOSIS — Z1231 Encounter for screening mammogram for malignant neoplasm of breast: Secondary | ICD-10-CM

## 2016-11-09 DIAGNOSIS — Z01419 Encounter for gynecological examination (general) (routine) without abnormal findings: Secondary | ICD-10-CM

## 2016-11-09 NOTE — Progress Notes (Signed)
No complaints today.   Pap Smear: Pap smear completed today. Last Pap smear was 05/31/2006 and normal. Per patient has no history of an abnormal Pap smear. Last Pap smear result is in EPIC.  Physical exam: Breasts Breasts symmetrical. No skin abnormalities bilateral breasts. No nipple retraction bilateral breasts. No nipple discharge bilateral breasts. No lymphadenopathy. No lumps palpated bilateral breasts. No complaints of pain or tenderness on exam. Referred patient to the [facility] for a screening mammogram. Appointment scheduled for Thursday, Nov 09, 2016 at 1510.  Pelvic/Bimanual   Ext Genitalia No lesions, no swelling and no discharge observed on external genitalia.         Vagina Vagina pink and normal texture. No lesions or discharge observed in vagina.          Cervix Cervix is present. Cervix pink and of normal texture. No discharge observed.     Uterus Uterus is present and palpable. Uterus in normal position and normal size.        Adnexae Bilateral ovaries present and palpable. No tenderness on palpation.          Rectovaginal No rectal exam completed today since patient had no rectal complaints. No skin abnormalities observed on exam.    Smoking History: Patient has never smoked.  Patient Navigation: Patient education provided. Access to services provided for patient through [facility] program.   Colorectal Cancer Screening: Per patient has never had a colonoscopy completed. No complaints today. FIT Test given to patient to complete and return to [facility].

## 2016-11-09 NOTE — Patient Instructions (Signed)
Explained breast self awareness with Sonnie Alamo.  Let patient know [facility] will cover Pap smears and HPV typing every 5 years unless has a history of abnormal Pap smears. Referred patient to the [facility] for a screening mammogram. Appointment scheduled for Thursday, Nov 09, 2016 at 1510. Let patient know will follow up with her within the next couple weeks with results of Pap smear by phone. Informed patient that [facility] will [facility] up with her within the next couple of weeks with results of mammogram by letter or phone. Correen L Calles verbalized understanding.  Jaxxson [place], Arvil Chaco, RN 4:07 PM

## 2016-11-10 ENCOUNTER — Encounter (HOSPITAL_COMMUNITY): Payer: Self-pay | Admitting: *Deleted

## 2016-11-10 ENCOUNTER — Encounter: Payer: Self-pay | Admitting: Internal Medicine

## 2016-11-10 LAB — CYTOLOGY - PAP
DIAGNOSIS: NEGATIVE
HPV: NOT DETECTED

## 2016-11-13 ENCOUNTER — Encounter: Payer: Self-pay | Admitting: Internal Medicine

## 2016-11-17 ENCOUNTER — Other Ambulatory Visit (HOSPITAL_COMMUNITY): Payer: Self-pay | Admitting: *Deleted

## 2016-11-17 DIAGNOSIS — B379 Candidiasis, unspecified: Secondary | ICD-10-CM

## 2016-11-17 MED ORDER — FLUCONAZOLE 150 MG PO TABS
150.0000 mg | ORAL_TABLET | Freq: Once | ORAL | 0 refills | Status: AC
Start: 2016-11-17 — End: 2016-11-17

## 2016-11-21 ENCOUNTER — Telehealth (HOSPITAL_COMMUNITY): Payer: Self-pay | Admitting: *Deleted

## 2016-11-21 MED FILL — LISINOPRIL-HCTZ 20-12.5 MG: 20-12.5 | 30 days supply | Qty: 30 | Fill #9

## 2016-11-21 MED FILL — PERPHENAZINE 4 MG TABLET: 4 | 30 days supply | Qty: 30 | Fill #1

## 2016-11-21 MED FILL — traZODone HCL 150 MG TABS: 150 | 15 days supply | Qty: 30 | Fill #1

## 2016-11-21 MED FILL — FLUoxetine HCL 40 MG CAPS: 40 | 30 days supply | Qty: 30 | Fill #0

## 2016-11-21 MED FILL — METFORMIN HCL ER 500 MG TAB: 500 | 30 days supply | Qty: 30 | Fill #9

## 2016-11-21 MED FILL — PERPHENAZINE 2 MG TAB: 2 | 30 days supply | Qty: 60 | Fill #0

## 2016-11-21 NOTE — Telephone Encounter (Signed)
Telephoned patient at home number and left message to return call to [facility] 

## 2016-11-24 ENCOUNTER — Telehealth (HOSPITAL_COMMUNITY): Payer: Self-pay | Admitting: *Deleted

## 2016-11-24 NOTE — Telephone Encounter (Signed)
Telephoned patient at home number and left message to return call to [facility] 

## 2017-01-01 MED FILL — METFORMIN HCL ER 500 MG TAB: 500 | 30 days supply | Qty: 30 | Fill #10

## 2017-01-01 MED FILL — PERPHENAZINE 4 MG TABLET: 4 | 30 days supply | Qty: 30 | Fill #2

## 2017-01-01 MED FILL — traZODone HCL 150 MG TABS: 150 | 15 days supply | Qty: 30 | Fill #2

## 2017-01-01 MED FILL — LISINOPRIL-HCTZ 20-12.5 MG: 20-12.5 | 30 days supply | Qty: 30 | Fill #10

## 2017-01-01 MED FILL — FLUoxetine HCL 40 MG CAPS: 40 | 30 days supply | Qty: 30 | Fill #1

## 2017-01-10 ENCOUNTER — Encounter (HOSPITAL_COMMUNITY): Payer: Self-pay | Admitting: *Deleted

## 2017-01-10 NOTE — Progress Notes (Signed)
Letter mailed to patient with with negative pap smear results. Advised patient yeast was seen on pap smear and medication called to pharmacy.

## 2017-01-30 MED FILL — LISINOPRIL-HCTZ 20-12.5 MG: 20-12.5 | 30 days supply | Qty: 30 | Fill #11

## 2017-01-30 MED FILL — METFORMIN HCL ER 500 MG TAB: 500 | 30 days supply | Qty: 30 | Fill #11

## 2017-02-09 ENCOUNTER — Ambulatory Visit: Payer: Medicare HMO | Attending: Family Medicine | Admitting: Licensed Clinical Social Worker

## 2017-02-09 ENCOUNTER — Ambulatory Visit: Payer: Medicare HMO | Attending: Family Medicine | Admitting: Family Medicine

## 2017-02-09 ENCOUNTER — Encounter: Payer: Self-pay | Admitting: Family Medicine

## 2017-02-09 VITALS — BP 118/78 | HR 88 | Temp 97.8°F | Resp 18 | Ht 68.0 in | Wt 275.4 lb

## 2017-02-09 DIAGNOSIS — H538 Other visual disturbances: Secondary | ICD-10-CM | POA: Diagnosis not present

## 2017-02-09 DIAGNOSIS — R69 Illness, unspecified: Secondary | ICD-10-CM | POA: Diagnosis not present

## 2017-02-09 DIAGNOSIS — Z8659 Personal history of other mental and behavioral disorders: Secondary | ICD-10-CM

## 2017-02-09 DIAGNOSIS — F419 Anxiety disorder, unspecified: Secondary | ICD-10-CM | POA: Insufficient documentation

## 2017-02-09 DIAGNOSIS — Z7984 Long term (current) use of oral hypoglycemic drugs: Secondary | ICD-10-CM | POA: Diagnosis not present

## 2017-02-09 DIAGNOSIS — E089 Diabetes mellitus due to underlying condition without complications: Secondary | ICD-10-CM | POA: Diagnosis not present

## 2017-02-09 DIAGNOSIS — F329 Major depressive disorder, single episode, unspecified: Secondary | ICD-10-CM | POA: Insufficient documentation

## 2017-02-09 DIAGNOSIS — Z79899 Other long term (current) drug therapy: Secondary | ICD-10-CM | POA: Diagnosis not present

## 2017-02-09 DIAGNOSIS — Z7951 Long term (current) use of inhaled steroids: Secondary | ICD-10-CM | POA: Insufficient documentation

## 2017-02-09 DIAGNOSIS — E119 Type 2 diabetes mellitus without complications: Secondary | ICD-10-CM | POA: Insufficient documentation

## 2017-02-09 DIAGNOSIS — F32A Depression, unspecified: Secondary | ICD-10-CM

## 2017-02-09 DIAGNOSIS — I10 Essential (primary) hypertension: Secondary | ICD-10-CM | POA: Insufficient documentation

## 2017-02-09 LAB — POCT GLYCOSYLATED HEMOGLOBIN (HGB A1C): Hemoglobin A1C: 5.3

## 2017-02-09 LAB — GLUCOSE, POCT (MANUAL RESULT ENTRY): POC Glucose: 164 mg/dl — AB (ref 70–99)

## 2017-02-09 MED ORDER — LISINOPRIL-HYDROCHLOROTHIAZIDE 20-12.5 MG PO TABS: 1.0000 | ORAL_TABLET | Freq: Every day | ORAL | 3 refills | Status: AC

## 2017-02-09 MED ORDER — METFORMIN HCL ER 500 MG PO TB24: 500.0000 mg | ORAL_TABLET | Freq: Every day | ORAL | 3 refills | Status: AC

## 2017-02-09 NOTE — Progress Notes (Signed)
Patient is here for f/up  

## 2017-02-09 NOTE — BH Specialist Note (Signed)
Integrated Behavioral Health Initial Visit  MRN: [id number] Name: [name]   Session Start time: 2:55 PM Session End time: 3:30 PM Total time: 35 minutes  Type of Service: [place] Interpretor:No. Interpretor Name and Language: N/A   Warm Hand Off Completed.       SUBJECTIVE: [name] is a 51 y.o. female accompanied by patient. Patient was referred by FNP [place] for depression and anxiety. Patient reports the following symptoms/concerns: overwhelming feelings of sadness and worry, difficulty sleeping, low energy, poor appetite, difficulty concentrating, and irritability Duration of problem: "it began in my young adult life"; Severity of problem: severe  OBJECTIVE: Mood: Pleasant and Affect: Appropriate Risk of harm to self or others: No plan to harm self or others   LIFE CONTEXT: Family and Social: Pt receives support from her mother and brother. She has a Designer, jewellery that helps with medication and a therapist through [facility] in [place]  School/Work: Pt is unemployed. She receives Disability [phone]), food stamps ($100), and Medicare Self-Care: Pt recently joined [employer] and scheduled appointment with a Physiological scientist on 08/22. She volunteers with the homeless Life Changes: Pt experiences chronic pain. She has increased anxiety about mother's chronic health concerns.  GOALS ADDRESSED: Patient will reduce symptoms of: anxiety and depression and increase knowledge and/or ability of: coping skills and also: Increase healthy adjustment to current life circumstances and Increase adequate support systems for patient/family   INTERVENTIONS: Solution-Focused Strategies, Supportive Counseling, Psychoeducation and/or Health Education and Link to [employer]  Standardized Assessments completed: GAD-7 and PHQ 2&9  ASSESSMENT: Patient currently experiencing depression and anxiety triggered  by mother's chronic health concerns. Patient reports overwhelming feelings of sadness and worry, difficulty sleeping, low energy, poor appetite, difficulty concentrating, and irritability. She participates in psychotherapy and medication management through [facility] located in [place], [place] discussed benefits of applying healthy coping skills to decrease symptoms. Pt successfully identified healthy strategies to implement on a routine basis. LCSWA provided pt with resources for food insecurity and crisis intervention.   PLAN: 1. Follow up with behavioral health clinician on : Pt was encouraged to contact [place] if symptoms worsen or fail to improve to schedule behavioral appointments at [facility]. 2. Behavioral recommendations: LCSWA recommends that pt apply healthy coping skills discussed, continue with behavioral health services through [facility], and utilize provided resources. Pt is encouraged to schedule follow up appointment with LCSWA 3. Referral(s): Community Resources:  Food 4. "From scale of 1-10, how likely are you to follow plan?": 9/10  Rebekah Chesterfield, LCSW 02/13/17 4:22 PM

## 2017-02-09 NOTE — Progress Notes (Signed)
Subjective:  Patient ID: [name], female    DOB: Nov 06, 1965  Age: 51 y.o. MRN: [id number]  CC: Diabetes and Hypertension   HPI [name] presents for Diabetes Mellitus: Patient presents for follow up of diabetes. Symptoms: blurred vision, which began a few years ago.She does report history of reading glasses. Patient denies foot ulcerations, nausea, paresthesia of the feet, polydipsia, polyuria, visual disturbances and vomitting.  Evaluation to date has been included: fasting blood sugar and hemoglobin A1C.  Home sugars: patient does not check sugars. Treatment to date: metformin. History of HTN. Recent ED visit for CP  She is not exercising and is not adherent to low salt diet.  She does not check BP at home. Cardiac symptoms none. Patient denies chest pain, chest pressure/discomfort, claudication, dyspnea, lower extremity edema, near-syncope, palpitations and syncope.  Cardiovascular risk factors: diabetes mellitus, hypertension, sedentary lifestyle and smoking/ tobacco exposure. Use of agents associated with hypertension: none. History of target organ damage: none. History of anxiety or depression.  She has the following symptoms: difficulty concentrating, feelings of losing control, racing thoughts. Onset of symptoms was several years ago and has gradually worsened since that time. She denies current suicidal and homicidal ideation.Marland KitchenPossible organic causes contributing are: none. Risk factors: previous episode of depression . She declines medications at this time. She is agreeable to speaking with the LCSW at this time.   Outpatient Medications Prior to Visit  Medication Sig Dispense Refill  . cholecalciferol (VITAMIN D) 1000 UNITS tablet Take 1,000 Units by mouth daily.    [name] FLUoxetine (PROZAC) 20 MG tablet Take 20 mg by mouth daily.    . fluticasone (FLONASE) 50 MCG/ACT nasal spray Place 1 spray into both nostrils daily as needed for allergies or rhinitis. (Patient not  taking: Reported on 11/09/2016) 16 g 5  . mometasone-formoterol (DULERA) 200-5 MCG/ACT AERO Inhale 2 puffs into the lungs 2 (two) times daily. 39 g 3  . benzonatate (TESSALON) 200 MG capsule Take 1 capsule (200 mg total) by mouth 3 (three) times daily as needed for cough. (Patient not taking: Reported on 09/11/2016) 20 capsule 0  . lisinopril-hydrochlorothiazide (ZESTORETIC) 20-12.5 MG tablet Take 1 tablet by mouth daily. 90 tablet 3  . metFORMIN (GLUCOPHAGE XR) 500 MG 24 hr tablet Take 1 tablet (500 mg total) by mouth daily with breakfast. 90 tablet 3  . perphenazine (TRILAFON) 8 MG tablet Take 1 tablet (8 mg total) by mouth at bedtime. 30 tablet 0  . Terbinafine (LAMISIL ADVANCED) 1 % GEL May apply twice daily 12 g 1   No facility-administered medications prior to visit.     ROS Review of Systems  Constitutional: Negative.   Eyes: Negative.   Respiratory: Negative.   Cardiovascular: Negative.   Gastrointestinal: Negative.   Musculoskeletal: Negative.   Skin: Negative.   Neurological: Negative.   Psychiatric/Behavioral: Positive for dysphoric mood. The patient is nervous/anxious.     Objective:  BP 118/78 (BP Location: Left Arm, Patient Position: Sitting, Cuff Size: Normal)   Pulse 88   Temp 97.8 F (36.6 C) (Oral)   Resp 18   Ht 5\' 8"  (1.727 m)   Wt 275 lb 6.4 oz (124.9 kg)   SpO2 97%   BMI 41.87 kg/m   BP/Weight 02/09/2017 11/09/2016 0/93/2671  Systolic BP 245 809 983  Diastolic BP 78 74 78  Wt. (Lbs) 275.4 274.2 287.6  BMI 41.87 41.69 42.47   Physical Exam  Constitutional: She is oriented to person, place, and time.  She appears well-developed and well-nourished.  Eyes: Pupils are equal, round, and reactive to light. Conjunctivae are normal.  Neck: Normal range of motion. Neck supple. No JVD present.  Cardiovascular: Normal rate, regular rhythm, normal heart sounds and intact distal pulses.   Pulmonary/Chest: Effort normal and breath sounds normal.  Abdominal: Soft.  Bowel sounds are normal. There is no tenderness.  Neurological: She is alert and oriented to person, place, and time.  Skin: Skin is warm and dry.  Psychiatric: Her mood appears anxious. She expresses no homicidal and no suicidal ideation. She expresses no suicidal plans and no homicidal plans.  Nursing note and vitals reviewed.   GAD 7 : Generalized Anxiety Score 02/09/2017 09/11/2016 08/14/2016 02/07/2016  Nervous, Anxious, on Edge 3 3 3 3   Control/stop worrying 3 3 3 3   Worry too much - different things 3 3 3 3   Trouble relaxing 2 2 3 2   Restless 1 1 1 1   Easily annoyed or irritable 2 3 2 3   Afraid - awful might happen 3 3 3 3   Total GAD 7 Score 17 18 18 18    Depression screen [facility] 2/9 02/09/2017 09/11/2016 08/14/2016  Decreased Interest 3 3 3   Down, Depressed, Hopeless 3 3 3   PHQ - 2 Score 6 6 6   Altered sleeping 3 3 2   Tired, decreased energy 3 3 3   Change in appetite 3 3 3   Feeling bad or failure about yourself  3 3 3   Trouble concentrating 2 2 2   Moving slowly or fidgety/restless 1 1 1   Suicidal thoughts 0 0 1  PHQ-9 Score 21 21 21      Assessment & Plan:   Problem List Items Addressed This Visit      Cardiovascular and Mediastinum   Essential hypertension   Relevant Medications   lisinopril-hydrochlorothiazide (ZESTORETIC) 20-12.5 MG tablet     Endocrine   Diabetes mellitus due to underlying condition without complications (HCC) - Primary   Relevant Medications   metFORMIN (GLUCOPHAGE XR) 500 MG 24 hr tablet   lisinopril-hydrochlorothiazide (ZESTORETIC) 20-12.5 MG tablet   Other Relevant Orders   HgB A1c (Completed)   Glucose (CBG) (Completed)   Ambulatory referral to Ophthalmology    Other Visit Diagnoses    Blurred vision, bilateral       Relevant Orders   Ambulatory referral to Ophthalmology   History of major depression       LCSW spoke with patient and provided resources   History of anxiety disorder          LCSW spoke with patient and provided resources       Meds ordered this encounter  Medications  . metFORMIN (GLUCOPHAGE XR) 500 MG 24 hr tablet    Sig: Take 1 tablet (500 mg total) by mouth daily with breakfast.    Dispense:  90 tablet    Refill:  3    Order Specific Question:   Supervising Provider    Answer:   Tresa Garter [id number]  . lisinopril-hydrochlorothiazide (ZESTORETIC) 20-12.5 MG tablet    Sig: Take 1 tablet by mouth daily.    Dispense:  90 tablet    Refill:  3    Order Specific Question:   Supervising Provider    Answer:   Tresa Garter [id number]    Follow-up: Return in about 3 months (around 05/12/2017) for HTN/DM.   Alfonse Spruce FNP

## 2017-02-09 NOTE — Patient Instructions (Signed)
Type 2 Diabetes Mellitus, Self Care, Adult Caring for yourself after you have been diagnosed with type 2 diabetes (type 2 diabetes mellitus) means keeping your blood sugar (glucose) under control with a balance of:  Nutrition.  Exercise.  Lifestyle changes.  Medicines or insulin, if necessary.  Support from your team of health care providers and others.  The following information explains what you need to know to manage your diabetes at home. What do I need to do to manage my blood glucose?  Check your blood glucose every day, as often as told by your health care provider.  Contact your health care provider if your blood glucose is above your target for 2 tests in a row.  Have your A1c (hemoglobin A1c) level checked at least two times a year, or as often as told by your health care provider. Your health care provider will set individualized treatment goals for you. Generally, the goal of treatment is to maintain the following blood glucose levels:  Before meals (preprandial): 80-130 mg/dL (4.4-7.2 mmol/L).  After meals (postprandial): below 180 mg/dL (10 mmol/L).  A1c level: less than 7%.  What do I need to know about hyperglycemia and hypoglycemia? What is hyperglycemia? Hyperglycemia, also called high blood glucose, occurs when blood glucose is too high.Make sure you know the early signs of hyperglycemia, such as:  Increased thirst.  Hunger.  Feeling very tired.  Needing to urinate more often than usual.  Blurry vision.  What is hypoglycemia? Hypoglycemia, also called low blood glucose, occurswith a blood glucose level at or below 70 mg/dL (3.9 mmol/L). The risk for hypoglycemia increases during or after exercise, during sleep, during illness, and when skipping meals or not eating for a long time (fasting). It is important to know the symptoms of hypoglycemia and treat it right away. Always have a 15-gram rapid-acting carbohydrate snack with you to treat low blood  glucose. Family members and close friends should also know the symptoms and should understand how to treat hypoglycemia, in case you are not able to treat yourself. What are the symptoms of hypoglycemia? Hypoglycemia symptoms can include:  Hunger.  Anxiety.  Sweating and feeling clammy.  Confusion.  Dizziness or feeling light-headed.  Sleepiness.  Nausea.  Increased heart rate.  Headache.  Blurry vision.  Seizure.  Nightmares.  Tingling or numbness around the mouth, lips, or tongue.  A change in speech.  Decreased ability to concentrate.  A change in coordination.  Restless sleep.  Tremors or shakes.  Fainting.  Irritability.  How do I treat hypoglycemia?  If you are alert and able to swallow safely, follow the 15:15 rule:  Take 15 grams of a rapid-acting carbohydrate. Rapid-acting options include: ? 1 tube of glucose gel. ? 3 glucose pills. ? 6-8 pieces of hard candy. ? 4 oz (120 mL) of fruit juice. ? 4 oz (120 mL) of regular (not diet) soda.  Check your blood glucose 15 minutes after you take the carbohydrate.  If the repeat blood glucose level is still at or below 70 mg/dL (3.9 mmol/L), take 15 grams of a carbohydrate again.  If your blood glucose level does not increase above 70 mg/dL (3.9 mmol/L) after 3 tries, seek emergency medical care.  After your blood glucose level returns to normal, eat a meal or a snack within 1 hour.  How do I treat severe hypoglycemia? Severe hypoglycemia is when your blood glucose level is at or below 54 mg/dL (3 mmol/L). Severe hypoglycemia is an emergency. Do not  wait to see if the symptoms will go away. Get medical help right away. Call your local emergency services (911 in the [place] Do not drive yourself to the hospital. If you have severe hypoglycemia and you cannot eat or drink, you may need an injection of glucagon. A family member or close friend should learn how to check your blood glucose and how to give you  a glucagon injection. Ask your health care provider if you need to have an emergency glucagon injection kit available. Severe hypoglycemia may need to be treated in a hospital. The treatment may include getting glucose through an IV tube. You may also need treatment for the cause of your hypoglycemia. Can having diabetes put me at risk for other conditions? Having diabetes can put you at risk for other long-term (chronic) conditions, such as heart disease and kidney disease. Your health care provider may prescribe medicines to help prevent complications from diabetes. These medicines may include:  Aspirin.  Medicine to lower cholesterol.  Medicine to control blood pressure.  What else can I do to manage my diabetes? Take your diabetes medicines as told  If your health care provider prescribed insulin or diabetes medicines, take them every day.  Do not run out of insulin or other diabetes medicines that you take. Plan ahead so you always have these available.  If you use insulin, adjust your dosage based on how physically active you are and what foods you eat. Your health care provider will tell you how to adjust your dosage. Make healthy food choices  The things that you eat and drink affect your blood glucose and your insulin dosage. Making good choices helps to control your diabetes and prevent other health problems. A healthy meal plan includes eating lean proteins, complex carbohydrates, fresh fruits and vegetables, low-fat dairy products, and healthy fats. Make an appointment to see a diet and nutrition specialist (registered dietitian) to help you create an eating plan that is right for you. Make sure that you:  Follow instructions from your health care provider about eating or drinking restrictions.  Drink enough fluid to keep your urine clear or pale yellow.  Eat healthy snacks between nutritious meals.  Track the carbohydrates that you eat. Do this by reading food labels and  learning the standard serving sizes of foods.  Follow your sick day plan whenever you cannot eat or drink as usual. Make this plan in advance with your health care provider.  Stay active  Exercise regularly, as told by your health care provider. This may include:  Stretching and doing strength exercises, such as yoga or weightlifting, at least 2 times a week.  Doing at least 150 minutes of moderate-intensity or vigorous-intensity exercise each week. This could be brisk walking, biking, or water aerobics. ? Spread out your activity over at least 3 days of the week. ? Do not go more than 2 days in a row without doing some kind of physical activity.  When you start a new exercise or activity, work with your health care provider to adjust your insulin, medicines, or food intake as needed. Make healthy lifestyle choices  Do not use any tobacco products, such as cigarettes, chewing tobacco, and e-cigarettes. If you need help quitting, ask your health care provider.  If your health care provider says that alcohol is safe for you, limit alcohol intake to no more than 1 drink per day for nonpregnant women and 2 drinks per day for men. One drink equals 12 oz of  beer, 5 oz of wine, or 1 oz of hard liquor.  Learn to manage stress. If you need help with this, ask your health care provider. Care for your body   Keep your immunizations up to date. In addition to getting vaccinations as told by your health care provider, it is recommended that you get vaccinated against the following illnesses: ? The flu (influenza). Get a flu shot every year. ? Pneumonia. ? Hepatitis B.  Schedule an eye exam soon after your diagnosis, and then one time every year after that.  Check your skin and feet every day for cuts, bruises, redness, blisters, or sores. Schedule a foot exam with your health care provider once every year.  Brush your teeth and gums two times a day, and floss at least one time a day. Visit your  dentist at least once every 6 months.  Maintain a healthy weight. General instructions  Take over-the-counter and prescription medicines only as told by your health care provider.  Share your diabetes management plan with people in your workplace, school, and household.  Check your urine for ketones when you are ill and as told by your health care provider.  Ask your health care provider: ? Do I need to meet with a diabetes educator? ? Where can I find a support group for people with diabetes?  Carry a medical alert card or wear medical alert jewelry.  Keep all follow-up visits as told by your health care provider. This is important. Where to find more information: For more information about diabetes, visit:  American Diabetes Association (ADA): www.diabetes.org  American Association of Diabetes Educators (AADE): www.diabeteseducator.org/patient-resources  This information is not intended to replace advice given to you by your health care provider. Make sure you discuss any questions you have with your health care provider. Document Released: 10/04/2015 Document Revised: 11/18/2015 Document Reviewed: 07/16/2015 Elsevier Interactive Patient Education  2017 [employer].

## 2017-02-27 ENCOUNTER — Telehealth (HOSPITAL_COMMUNITY): Payer: Self-pay

## 2017-02-27 NOTE — Telephone Encounter (Signed)
Left message with patient to remind about at home FIT Test that was given to the patient in [facility] on 11/09/2016. I let the patient know that if she had any questions she could call me back.

## 2017-03-13 ENCOUNTER — Other Ambulatory Visit: Payer: Self-pay | Admitting: Family Medicine

## 2017-04-12 ENCOUNTER — Telehealth (HOSPITAL_COMMUNITY): Payer: Self-pay | Admitting: *Deleted

## 2017-04-12 NOTE — Telephone Encounter (Signed)
Telephoned patient at home number and left message to return call to [facility] 

## 2017-04-16 DIAGNOSIS — E119 Type 2 diabetes mellitus without complications: Secondary | ICD-10-CM | POA: Diagnosis not present

## 2017-04-16 DIAGNOSIS — B353 Tinea pedis: Secondary | ICD-10-CM | POA: Diagnosis not present

## 2017-04-16 DIAGNOSIS — I10 Essential (primary) hypertension: Secondary | ICD-10-CM | POA: Diagnosis not present

## 2017-04-16 DIAGNOSIS — Z23 Encounter for immunization: Secondary | ICD-10-CM | POA: Diagnosis not present

## 2017-04-16 DIAGNOSIS — K9289 Other specified diseases of the digestive system: Secondary | ICD-10-CM | POA: Diagnosis not present

## 2017-05-07 ENCOUNTER — Ambulatory Visit: Payer: Self-pay | Admitting: Family Medicine

## 2017-06-06 ENCOUNTER — Other Ambulatory Visit: Payer: Self-pay | Admitting: Family Medicine

## 2017-06-06 DIAGNOSIS — I10 Essential (primary) hypertension: Secondary | ICD-10-CM

## 2017-08-20 DIAGNOSIS — M62830 Muscle spasm of back: Secondary | ICD-10-CM | POA: Diagnosis not present

## 2017-08-20 DIAGNOSIS — E119 Type 2 diabetes mellitus without complications: Secondary | ICD-10-CM | POA: Diagnosis not present

## 2017-08-20 DIAGNOSIS — I10 Essential (primary) hypertension: Secondary | ICD-10-CM | POA: Diagnosis not present

## 2017-08-20 DIAGNOSIS — J3489 Other specified disorders of nose and nasal sinuses: Secondary | ICD-10-CM | POA: Diagnosis not present

## 2017-10-23 ENCOUNTER — Other Ambulatory Visit: Payer: Self-pay | Admitting: Family Medicine

## 2017-10-23 ENCOUNTER — Ambulatory Visit
Admission: RE | Admit: 2017-10-23 | Discharge: 2017-10-23 | Disposition: A | Discharge status: Home / Self Care | Payer: Medicare HMO | Source: Ambulatory Visit | Attending: Family Medicine | Admitting: Family Medicine

## 2017-10-23 DIAGNOSIS — R05 Cough: Secondary | ICD-10-CM | POA: Diagnosis not present

## 2017-10-23 DIAGNOSIS — Z0000 Encounter for general adult medical examination without abnormal findings: Secondary | ICD-10-CM | POA: Diagnosis not present

## 2017-10-23 DIAGNOSIS — H9190 Unspecified hearing loss, unspecified ear: Secondary | ICD-10-CM | POA: Diagnosis not present

## 2017-10-23 DIAGNOSIS — I10 Essential (primary) hypertension: Secondary | ICD-10-CM | POA: Diagnosis not present

## 2017-10-23 DIAGNOSIS — R69 Illness, unspecified: Secondary | ICD-10-CM | POA: Diagnosis not present

## 2017-10-23 DIAGNOSIS — J189 Pneumonia, unspecified organism: Secondary | ICD-10-CM

## 2017-10-23 DIAGNOSIS — J441 Chronic obstructive pulmonary disease with (acute) exacerbation: Secondary | ICD-10-CM | POA: Diagnosis not present

## 2017-10-24 DIAGNOSIS — J45909 Unspecified asthma, uncomplicated: Secondary | ICD-10-CM | POA: Diagnosis not present

## 2017-10-24 DIAGNOSIS — J45901 Unspecified asthma with (acute) exacerbation: Secondary | ICD-10-CM | POA: Diagnosis not present

## 2017-10-24 DIAGNOSIS — E119 Type 2 diabetes mellitus without complications: Secondary | ICD-10-CM | POA: Diagnosis not present

## 2017-10-24 DIAGNOSIS — H9190 Unspecified hearing loss, unspecified ear: Secondary | ICD-10-CM | POA: Diagnosis not present

## 2017-10-24 DIAGNOSIS — I10 Essential (primary) hypertension: Secondary | ICD-10-CM | POA: Diagnosis not present

## 2017-10-26 ENCOUNTER — Emergency Department (HOSPITAL_COMMUNITY): Payer: Medicare HMO

## 2017-10-26 ENCOUNTER — Emergency Department (HOSPITAL_COMMUNITY)
Admission: EM | Admit: 2017-10-26 | Discharge: 2017-10-26 | Discharge status: Against Medical Advice | Payer: Medicare HMO

## 2017-10-26 DIAGNOSIS — I10 Essential (primary) hypertension: Secondary | ICD-10-CM | POA: Diagnosis not present

## 2017-10-26 DIAGNOSIS — R69 Illness, unspecified: Secondary | ICD-10-CM | POA: Diagnosis not present

## 2017-10-26 DIAGNOSIS — J441 Chronic obstructive pulmonary disease with (acute) exacerbation: Secondary | ICD-10-CM | POA: Diagnosis not present

## 2017-10-26 DIAGNOSIS — N393 Stress incontinence (female) (male): Secondary | ICD-10-CM | POA: Diagnosis not present

## 2017-10-26 MED ORDER — ALBUTEROL SULFATE (2.5 MG/3ML) 0.083% IN NEBU
5.0000 mg | INHALATION_SOLUTION | Freq: Once | RESPIRATORY_TRACT | Status: DC
  Filled 2017-10-26: qty 6

## 2017-10-26 NOTE — ED Notes (Signed)
Pt was called for triage at the 4 min and 11 min marker. Pt was not in the lobby per registration. Pt had yelled at registration clerk that she wanted a room right then. Registration explained to pt we would be out to get her but pt stated she would leave and call EMS so she wouldn't have to wait.

## 2017-10-30 DIAGNOSIS — J441 Chronic obstructive pulmonary disease with (acute) exacerbation: Secondary | ICD-10-CM | POA: Diagnosis not present

## 2017-11-01 DIAGNOSIS — K219 Gastro-esophageal reflux disease without esophagitis: Secondary | ICD-10-CM | POA: Diagnosis not present

## 2017-11-01 DIAGNOSIS — R69 Illness, unspecified: Secondary | ICD-10-CM | POA: Diagnosis not present

## 2017-11-01 DIAGNOSIS — Z6841 Body Mass Index (BMI) 40.0 and over, adult: Secondary | ICD-10-CM | POA: Diagnosis not present

## 2017-11-01 DIAGNOSIS — I10 Essential (primary) hypertension: Secondary | ICD-10-CM | POA: Diagnosis not present

## 2017-11-06 ENCOUNTER — Other Ambulatory Visit: Payer: Self-pay | Admitting: Surgical Oncology

## 2017-11-06 DIAGNOSIS — K219 Gastro-esophageal reflux disease without esophagitis: Secondary | ICD-10-CM

## 2017-11-07 DIAGNOSIS — Z713 Dietary counseling and surveillance: Secondary | ICD-10-CM | POA: Diagnosis not present

## 2017-11-07 DIAGNOSIS — Z6841 Body Mass Index (BMI) 40.0 and over, adult: Secondary | ICD-10-CM | POA: Diagnosis not present

## 2017-11-09 ENCOUNTER — Ambulatory Visit
Admission: RE | Admit: 2017-11-09 | Discharge: 2017-11-09 | Disposition: A | Discharge status: Home / Self Care | Payer: Medicare HMO | Source: Ambulatory Visit | Attending: Surgical Oncology | Admitting: Surgical Oncology

## 2017-11-09 DIAGNOSIS — K219 Gastro-esophageal reflux disease without esophagitis: Secondary | ICD-10-CM

## 2017-11-14 DIAGNOSIS — Z87891 Personal history of nicotine dependence: Secondary | ICD-10-CM | POA: Diagnosis not present

## 2017-11-14 DIAGNOSIS — I10 Essential (primary) hypertension: Secondary | ICD-10-CM | POA: Diagnosis not present

## 2017-11-14 DIAGNOSIS — Z6841 Body Mass Index (BMI) 40.0 and over, adult: Secondary | ICD-10-CM | POA: Diagnosis not present

## 2017-11-14 DIAGNOSIS — K219 Gastro-esophageal reflux disease without esophagitis: Secondary | ICD-10-CM | POA: Diagnosis not present

## 2017-11-14 DIAGNOSIS — R69 Illness, unspecified: Secondary | ICD-10-CM | POA: Diagnosis not present

## 2017-11-26 DIAGNOSIS — R69 Illness, unspecified: Secondary | ICD-10-CM | POA: Diagnosis not present

## 2017-11-26 DIAGNOSIS — I10 Essential (primary) hypertension: Secondary | ICD-10-CM | POA: Diagnosis not present

## 2017-11-26 DIAGNOSIS — M5412 Radiculopathy, cervical region: Secondary | ICD-10-CM | POA: Diagnosis not present

## 2017-11-29 DIAGNOSIS — R69 Illness, unspecified: Secondary | ICD-10-CM | POA: Diagnosis not present

## 2017-11-30 DIAGNOSIS — J441 Chronic obstructive pulmonary disease with (acute) exacerbation: Secondary | ICD-10-CM | POA: Diagnosis not present

## 2017-12-06 DIAGNOSIS — Z713 Dietary counseling and surveillance: Secondary | ICD-10-CM | POA: Diagnosis not present

## 2017-12-06 DIAGNOSIS — Z6841 Body Mass Index (BMI) 40.0 and over, adult: Secondary | ICD-10-CM | POA: Diagnosis not present

## 2017-12-17 DIAGNOSIS — R948 Abnormal results of function studies of other organs and systems: Secondary | ICD-10-CM | POA: Diagnosis not present

## 2017-12-17 DIAGNOSIS — Z6841 Body Mass Index (BMI) 40.0 and over, adult: Secondary | ICD-10-CM | POA: Diagnosis not present

## 2017-12-18 DIAGNOSIS — Z6841 Body Mass Index (BMI) 40.0 and over, adult: Secondary | ICD-10-CM | POA: Diagnosis not present

## 2017-12-30 DIAGNOSIS — J441 Chronic obstructive pulmonary disease with (acute) exacerbation: Secondary | ICD-10-CM | POA: Diagnosis not present

## 2018-01-30 DIAGNOSIS — J441 Chronic obstructive pulmonary disease with (acute) exacerbation: Secondary | ICD-10-CM | POA: Diagnosis not present

## 2018-03-02 DIAGNOSIS — J441 Chronic obstructive pulmonary disease with (acute) exacerbation: Secondary | ICD-10-CM | POA: Diagnosis not present

## 2018-04-01 DIAGNOSIS — J441 Chronic obstructive pulmonary disease with (acute) exacerbation: Secondary | ICD-10-CM | POA: Diagnosis not present

## 2018-04-16 DIAGNOSIS — H6092 Unspecified otitis externa, left ear: Secondary | ICD-10-CM | POA: Diagnosis not present

## 2018-04-16 DIAGNOSIS — Z23 Encounter for immunization: Secondary | ICD-10-CM | POA: Diagnosis not present

## 2018-04-16 DIAGNOSIS — H6122 Impacted cerumen, left ear: Secondary | ICD-10-CM | POA: Diagnosis not present

## 2018-04-16 DIAGNOSIS — H6642 Suppurative otitis media, unspecified, left ear: Secondary | ICD-10-CM | POA: Diagnosis not present

## 2018-05-02 DIAGNOSIS — J441 Chronic obstructive pulmonary disease with (acute) exacerbation: Secondary | ICD-10-CM | POA: Diagnosis not present

## 2018-06-01 DIAGNOSIS — J441 Chronic obstructive pulmonary disease with (acute) exacerbation: Secondary | ICD-10-CM | POA: Diagnosis not present

## 2018-07-02 DIAGNOSIS — J441 Chronic obstructive pulmonary disease with (acute) exacerbation: Secondary | ICD-10-CM | POA: Diagnosis not present

## 2018-07-18 DIAGNOSIS — H9190 Unspecified hearing loss, unspecified ear: Secondary | ICD-10-CM | POA: Diagnosis not present

## 2018-07-18 DIAGNOSIS — J45909 Unspecified asthma, uncomplicated: Secondary | ICD-10-CM | POA: Diagnosis not present

## 2018-08-02 DIAGNOSIS — J441 Chronic obstructive pulmonary disease with (acute) exacerbation: Secondary | ICD-10-CM | POA: Diagnosis not present

## 2018-08-31 DIAGNOSIS — J441 Chronic obstructive pulmonary disease with (acute) exacerbation: Secondary | ICD-10-CM | POA: Diagnosis not present

## 2019-03-27 DIAGNOSIS — J45909 Unspecified asthma, uncomplicated: Secondary | ICD-10-CM | POA: Diagnosis not present

## 2019-03-27 DIAGNOSIS — E119 Type 2 diabetes mellitus without complications: Secondary | ICD-10-CM | POA: Diagnosis not present

## 2019-03-27 DIAGNOSIS — I10 Essential (primary) hypertension: Secondary | ICD-10-CM | POA: Diagnosis not present

## 2019-03-27 DIAGNOSIS — Z23 Encounter for immunization: Secondary | ICD-10-CM | POA: Diagnosis not present

## 2019-03-27 DIAGNOSIS — B372 Candidiasis of skin and nail: Secondary | ICD-10-CM | POA: Diagnosis not present

## 2019-04-18 IMAGING — CR DG CHEST 2V
2 series · 2 of 2 positions shown · non-contrast
Comparison: 07/05/2011

CLINICAL DATA: Cough congestion chills and sweats x 6 days /
concern for PNA / jdh 315

EXAM:
CHEST - 2 VIEW

[w chest pa]
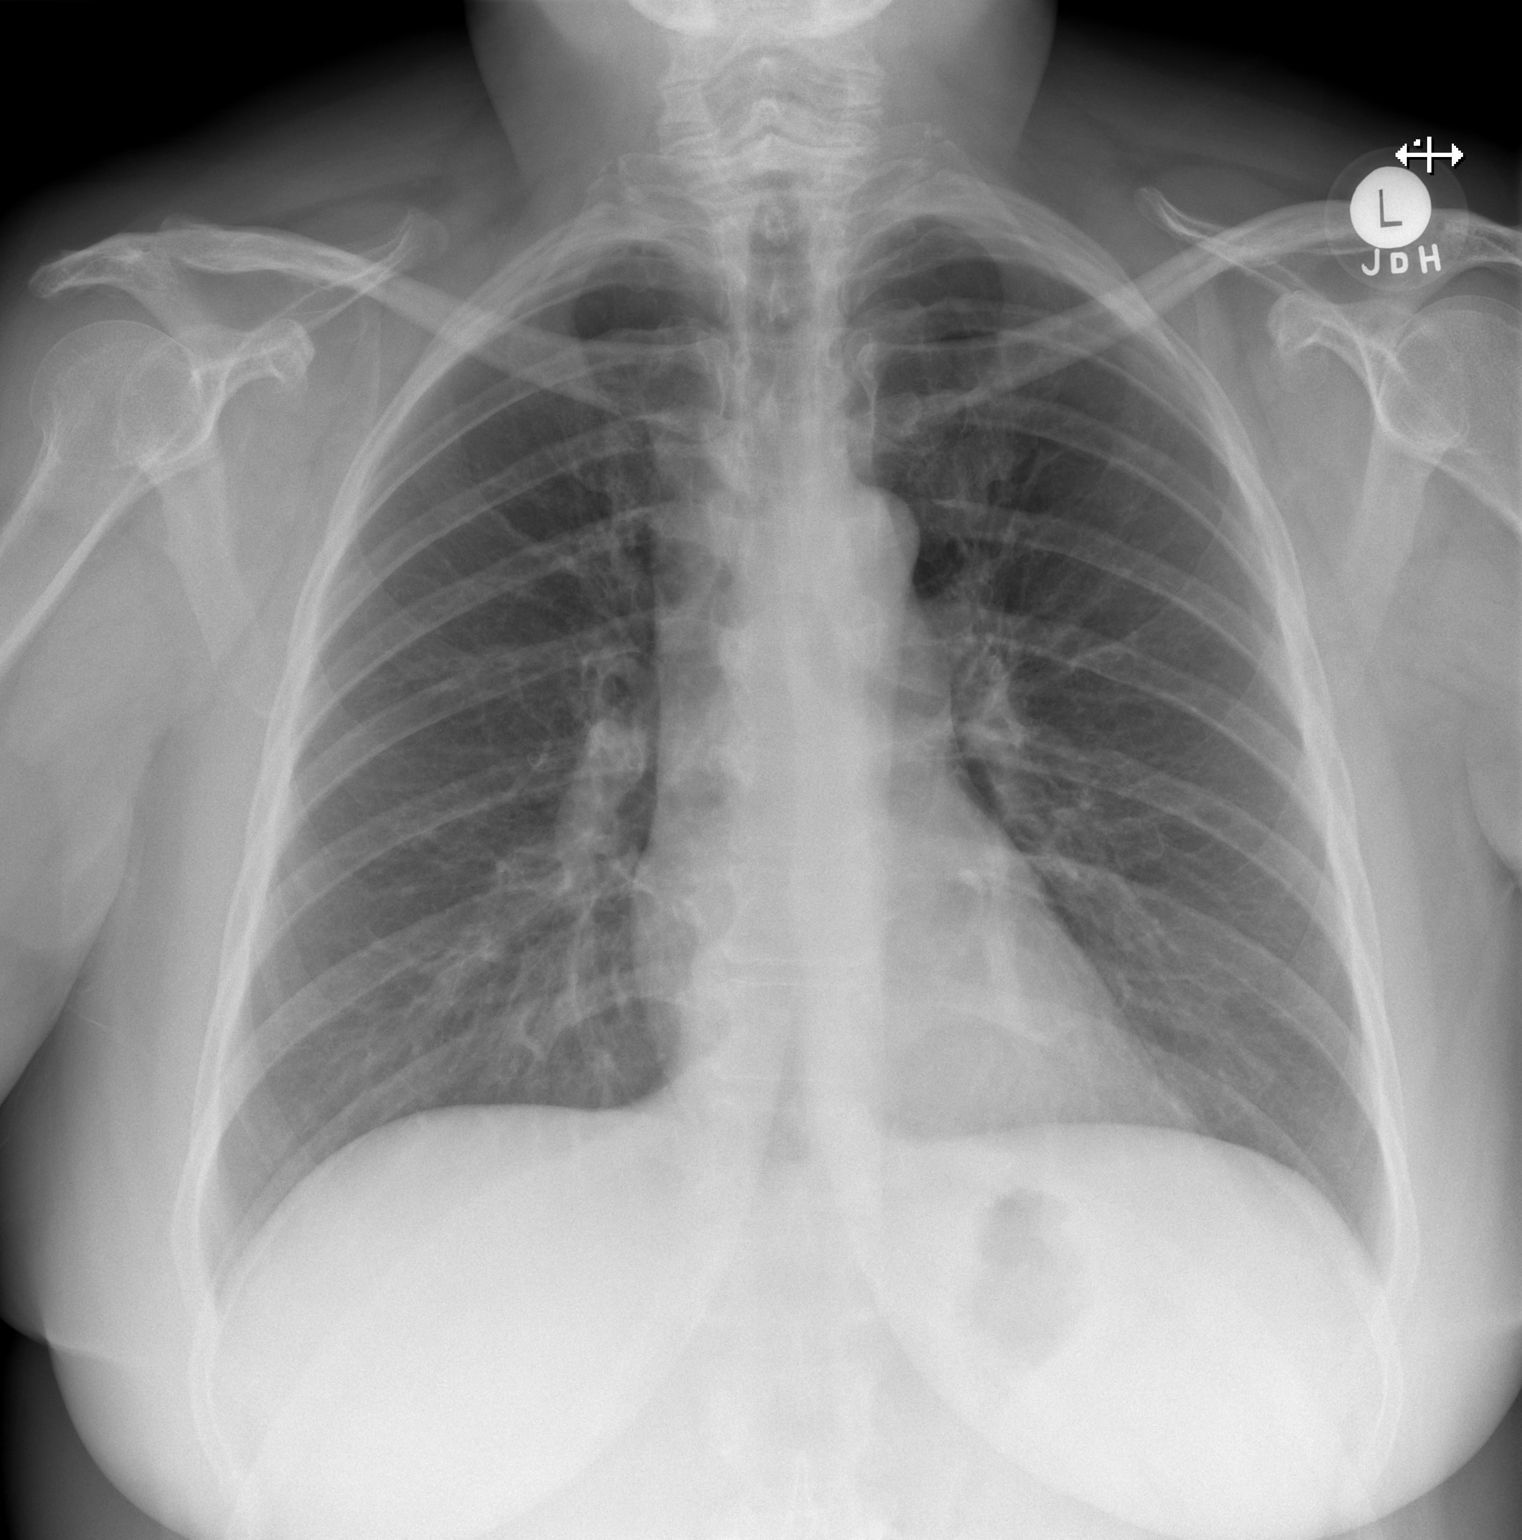

[w chest lat]
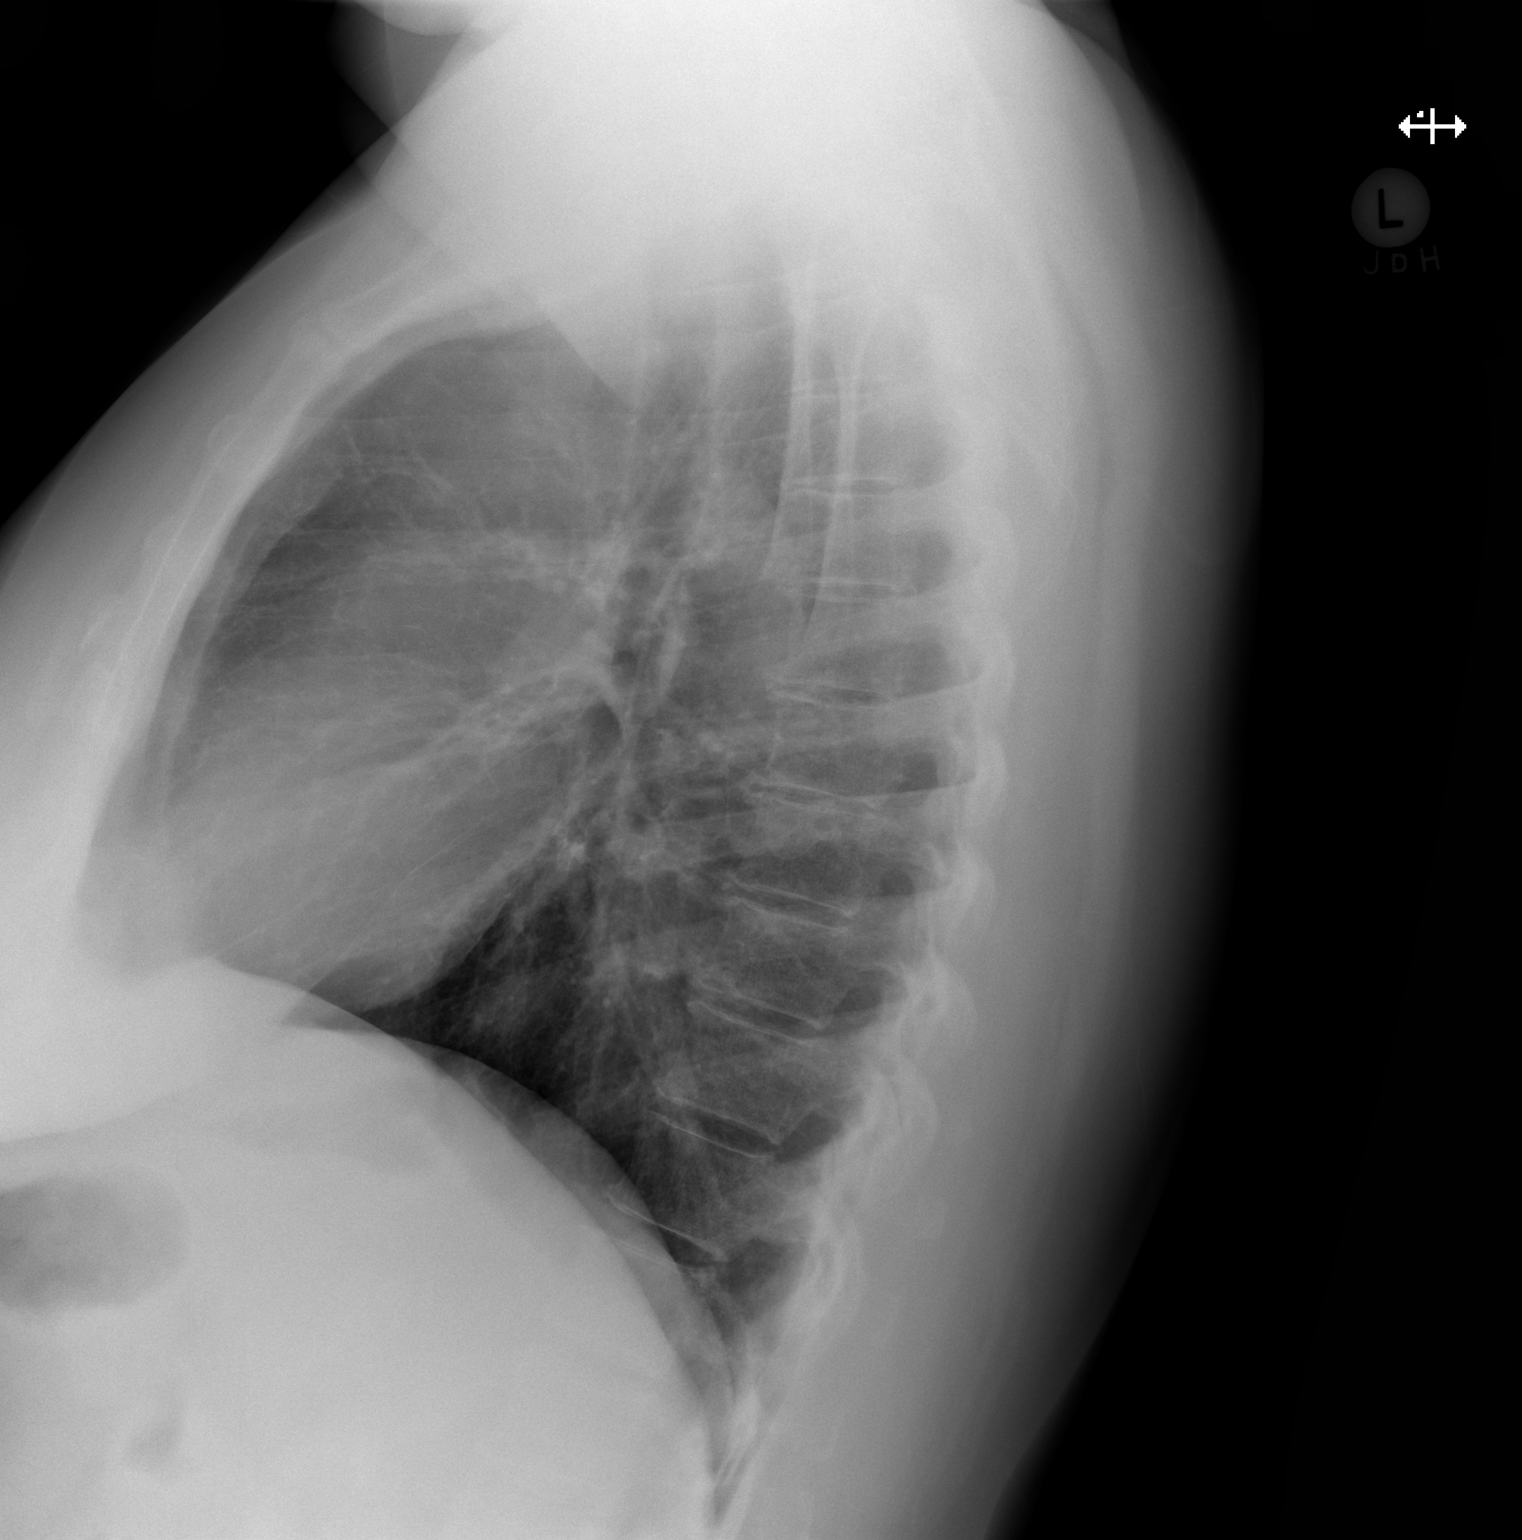

[2 of 2 positions shown; findings below may reference images not displayed]

FINDINGS: Midline trachea. Normal heart size and mediastinal contours. No
pleural effusion or pneumothorax. Clear lungs.
IMPRESSION: No acute cardiopulmonary disease.

## 2019-04-28 ENCOUNTER — Other Ambulatory Visit: Payer: Self-pay | Admitting: Family Medicine

## 2019-04-28 DIAGNOSIS — Z1231 Encounter for screening mammogram for malignant neoplasm of breast: Secondary | ICD-10-CM

## 2019-05-05 IMAGING — RF DG UGI W/ HIGH DENSITY W/KUB
1 series · 10 of 10 positions shown · non-contrast
Comparison: None.

CLINICAL DATA: Gastroesophageal reflux disease.

EXAM:
UPPER GI SERIES WITH KUB
TECHNIQUE: After obtaining a scout radiograph a routine upper GI series was
performed using thin and high density barium.
FLUOROSCOPY TIME:  Fluoroscopy Time:  1 minutes 12 seconds
Radiation Exposure Index (if provided by the fluoroscopic device):
176 mGy

[Series 1: one shot · 10 of 10 slices shown]
[im 1/10]
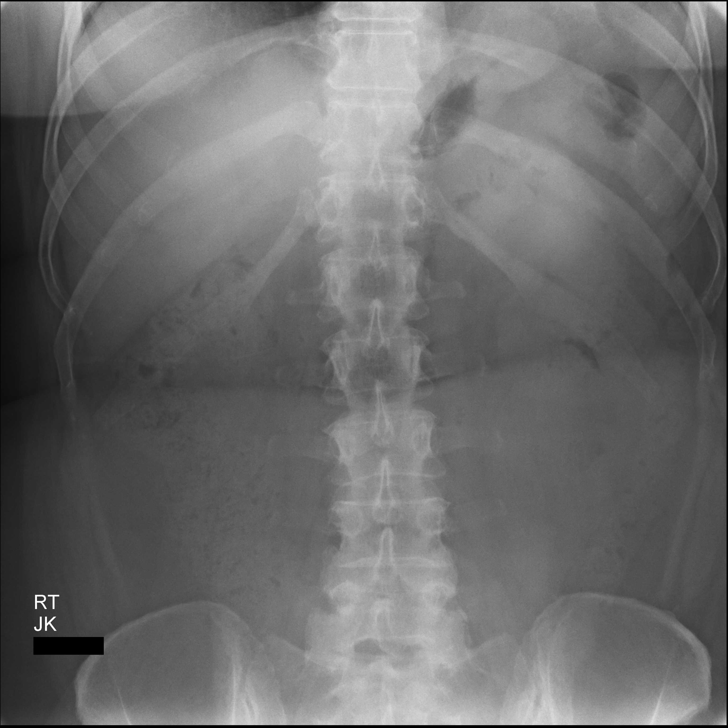
[im 2/10]
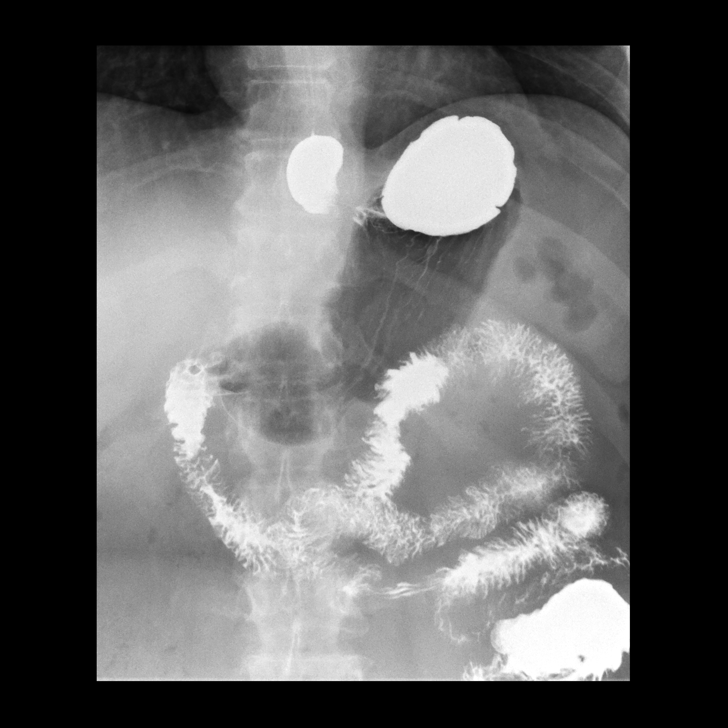
[im 3/10]
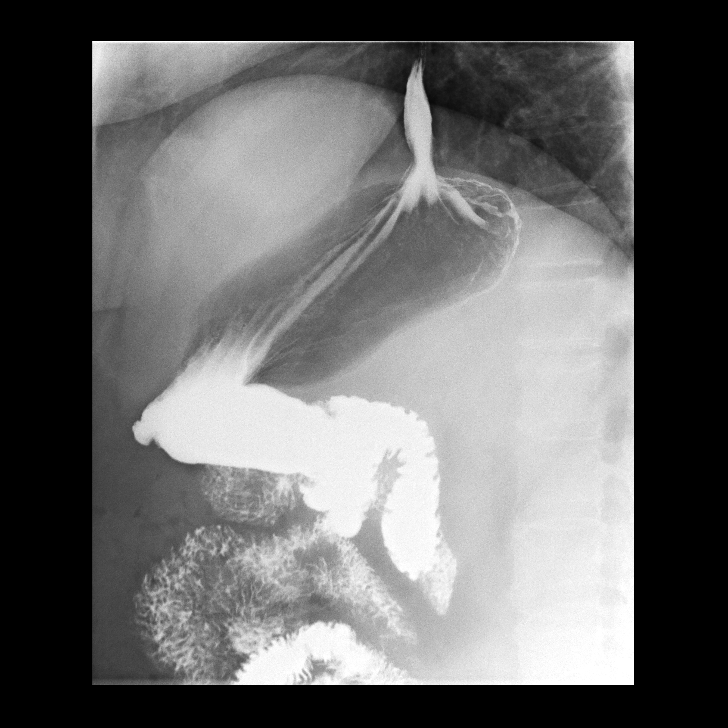
[im 4/10]
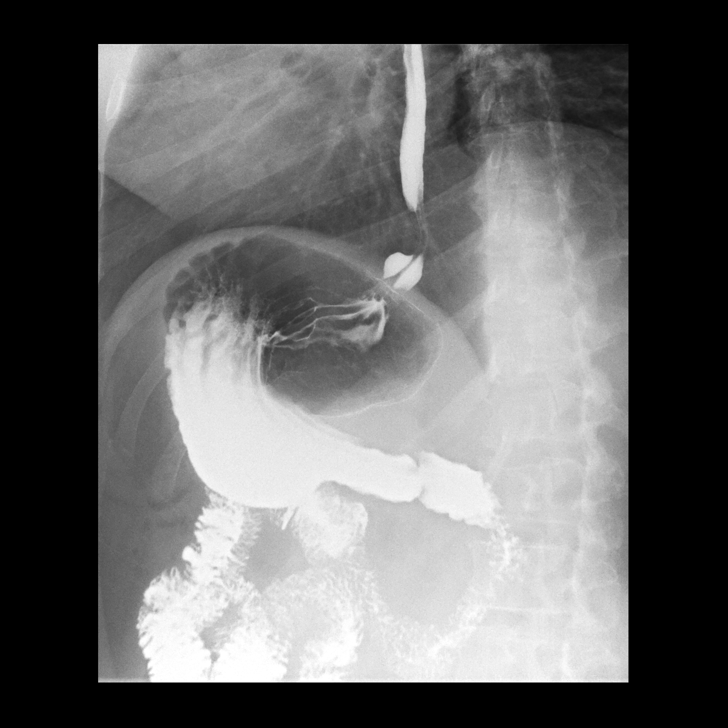
[im 5/10]
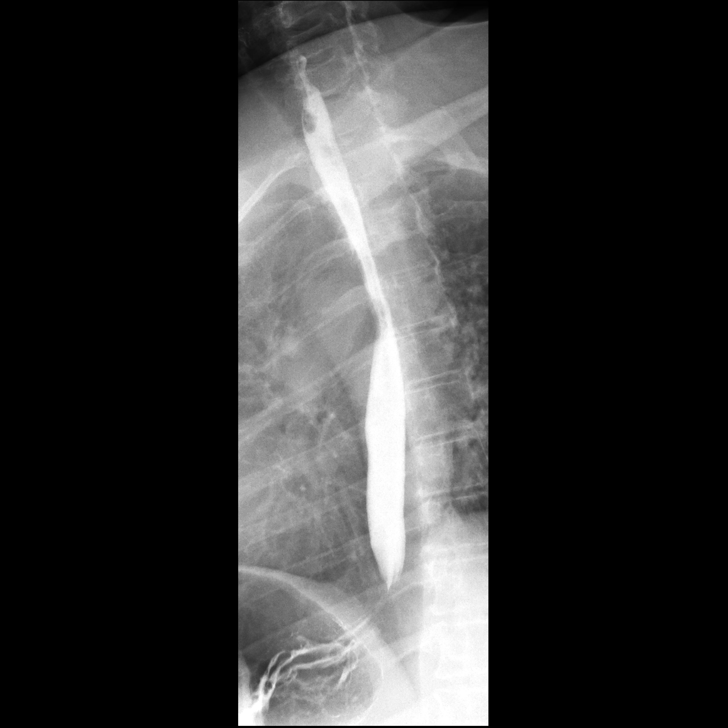
[im 6/10]
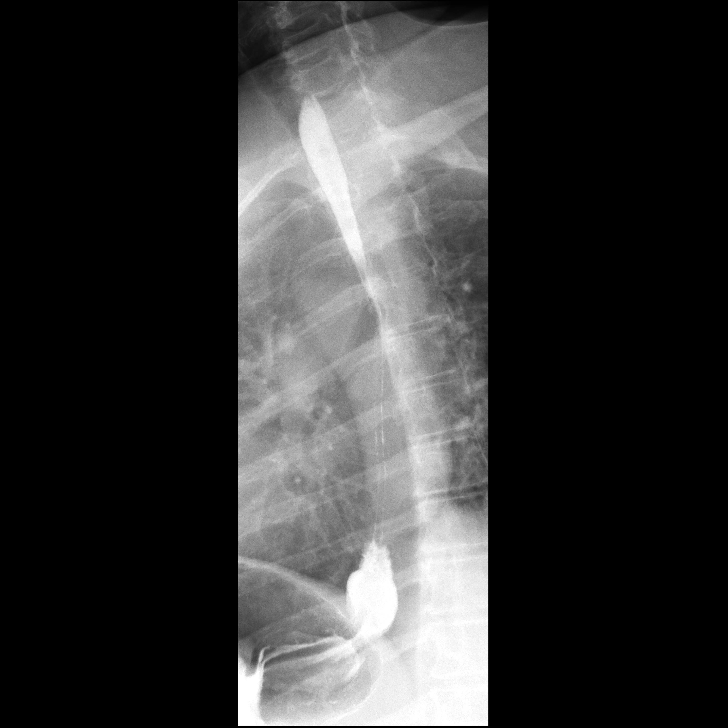
[im 7/10]
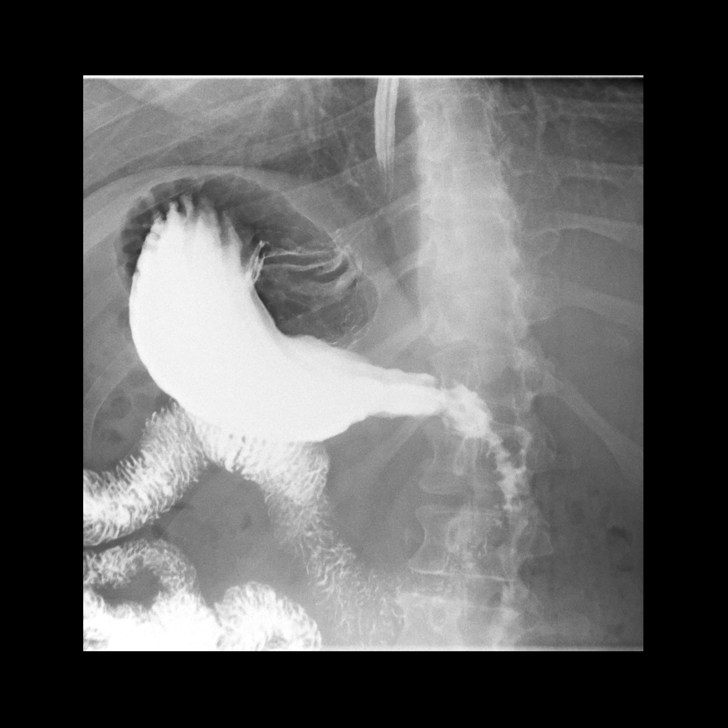
[im 8/10]
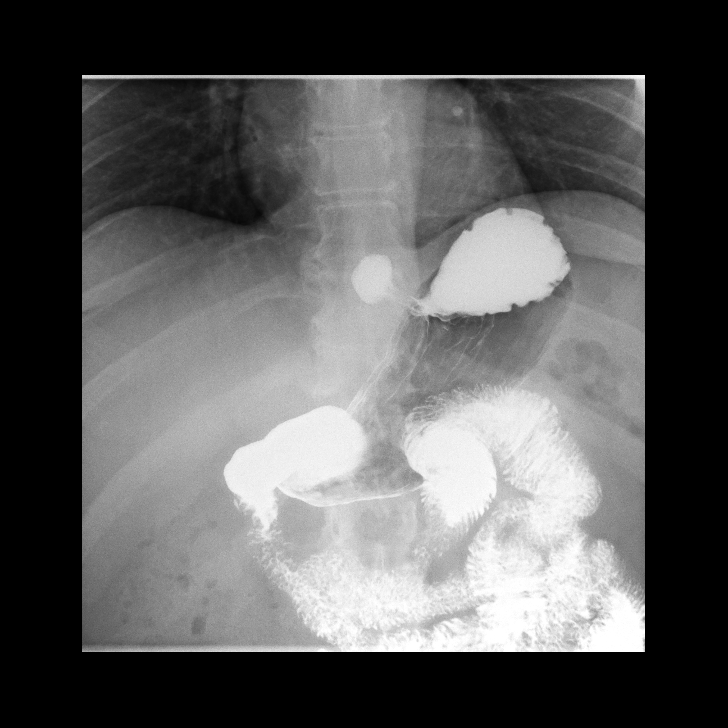
[im 9/10]
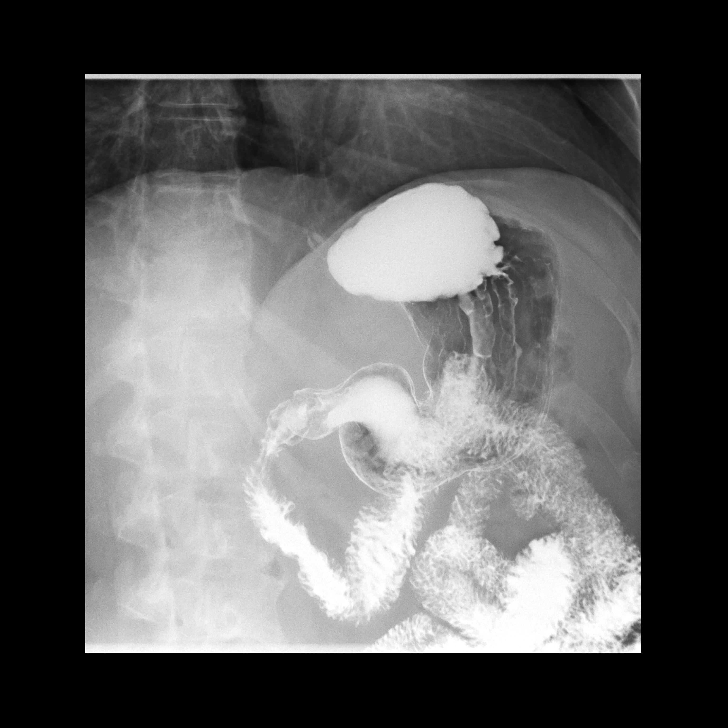
[im 10/10]
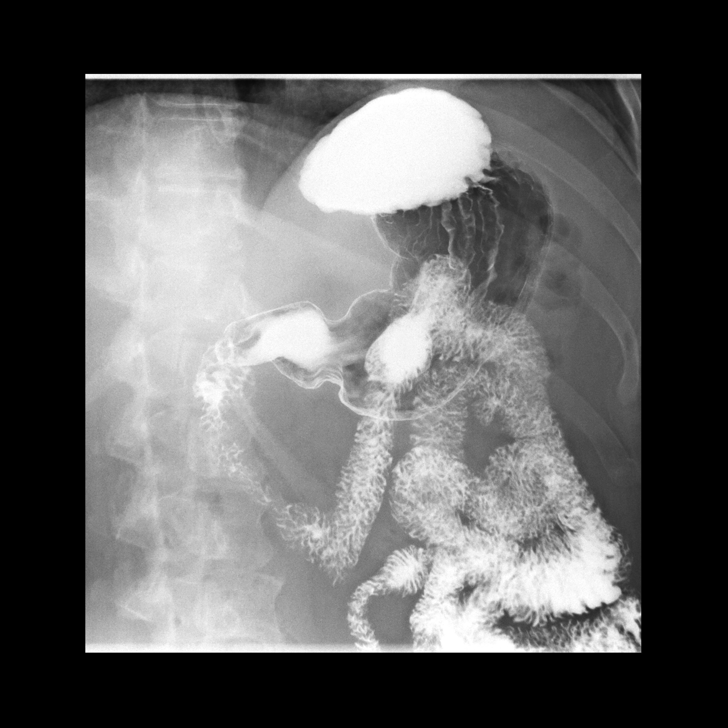

[10 of 10 positions shown; findings below may reference images not displayed]

FINDINGS: KUB demonstrates a normal bowel gas pattern. No abnormal abdominal
calcifications. Bones appear normal.

There is a small sliding hiatal hernia with slight reflux
demonstrated during the exam. However, the mucosa and motility of
the esophagus is normal.

The fundus, body, and antrum of the stomach are normal. Pylorus and
duodenal bulb and C-loop are normal. Visualized proximal small bowel
loops are normal.
IMPRESSION: Loop small sliding hiatal hernia.  Otherwise, normal exam.

## 2019-06-07 ENCOUNTER — Ambulatory Visit: Payer: Medicare HMO

## 2019-06-11 DIAGNOSIS — R69 Illness, unspecified: Secondary | ICD-10-CM | POA: Diagnosis not present

## 2019-09-06 ENCOUNTER — Ambulatory Visit: Payer: Medicare HMO | Attending: Internal Medicine

## 2019-09-06 DIAGNOSIS — Z23 Encounter for immunization: Secondary | ICD-10-CM

## 2019-09-06 NOTE — Progress Notes (Signed)
   Covid-19 Vaccination Clinic  Name:  [name]    MRN: [id number] DOB: [birth year]  09/06/2019  [name] was observed post Covid-19 immunization for 15 minutes without incident. She was provided with Vaccine Information Sheet and instruction to access the V-Safe system.   [name] was instructed to call 911 with any severe reactions post vaccine: [name] Difficulty breathing  . Swelling of face and throat  . A fast heartbeat  . A bad rash all over body  . Dizziness and weakness   Immunizations Administered    Name Date Dose VIS Date Route   Pfizer COVID-19 Vaccine 09/06/2019  4:12 PM 0.3 mL 06/06/2019 Intramuscular   Manufacturer: [place]   Lot: [id number]   [place]: [id number]

## 2019-10-01 ENCOUNTER — Ambulatory Visit: Payer: Medicare HMO | Attending: Internal Medicine

## 2019-10-01 DIAGNOSIS — Z23 Encounter for immunization: Secondary | ICD-10-CM

## 2019-10-01 NOTE — Progress Notes (Signed)
   Covid-19 Vaccination Clinic  Name:  [name]    MRN: [id number] DOB: [birth year]  10/01/2019  [name] was observed post Covid-19 immunization for 15 minutes without incident. She was provided with Vaccine Information Sheet and instruction to access the V-Safe system.   [name] was instructed to call 911 with any severe reactions post vaccine: [name] Difficulty breathing  . Swelling of face and throat  . A fast heartbeat  . A bad rash all over body  . Dizziness and weakness   Immunizations Administered    Name Date Dose VIS Date Route   Pfizer COVID-19 Vaccine 10/01/2019  4:26 PM 0.3 mL 06/06/2019 Intramuscular   Manufacturer: [place]   Lot: [id number]   [place]: [id number]

## 2019-12-05 DIAGNOSIS — H9190 Unspecified hearing loss, unspecified ear: Secondary | ICD-10-CM | POA: Diagnosis not present

## 2019-12-05 DIAGNOSIS — I10 Essential (primary) hypertension: Secondary | ICD-10-CM | POA: Diagnosis not present

## 2019-12-05 DIAGNOSIS — E119 Type 2 diabetes mellitus without complications: Secondary | ICD-10-CM | POA: Diagnosis not present

## 2019-12-05 DIAGNOSIS — R531 Weakness: Secondary | ICD-10-CM | POA: Diagnosis not present

## 2019-12-05 DIAGNOSIS — J45909 Unspecified asthma, uncomplicated: Secondary | ICD-10-CM | POA: Diagnosis not present

## 2019-12-05 DIAGNOSIS — R69 Illness, unspecified: Secondary | ICD-10-CM | POA: Diagnosis not present

## 2019-12-05 DIAGNOSIS — R42 Dizziness and giddiness: Secondary | ICD-10-CM | POA: Diagnosis not present

## 2019-12-05 DIAGNOSIS — H6021 Malignant otitis externa, right ear: Secondary | ICD-10-CM | POA: Diagnosis not present

## 2019-12-09 DIAGNOSIS — H6021 Malignant otitis externa, right ear: Secondary | ICD-10-CM | POA: Diagnosis not present

## 2019-12-09 DIAGNOSIS — I10 Essential (primary) hypertension: Secondary | ICD-10-CM | POA: Diagnosis not present

## 2019-12-09 DIAGNOSIS — H9190 Unspecified hearing loss, unspecified ear: Secondary | ICD-10-CM | POA: Diagnosis not present

## 2019-12-09 DIAGNOSIS — R69 Illness, unspecified: Secondary | ICD-10-CM | POA: Diagnosis not present

## 2020-01-29 ENCOUNTER — Other Ambulatory Visit: Payer: Self-pay

## 2020-01-29 ENCOUNTER — Other Ambulatory Visit (INDEPENDENT_AMBULATORY_CARE_PROVIDER_SITE_OTHER): Payer: Self-pay | Admitting: Otolaryngology

## 2020-01-29 ENCOUNTER — Ambulatory Visit (INDEPENDENT_AMBULATORY_CARE_PROVIDER_SITE_OTHER): Payer: Medicare HMO | Admitting: Otolaryngology

## 2020-01-29 VITALS — Temp 97.7°F

## 2020-01-29 DIAGNOSIS — H6690 Otitis media, unspecified, unspecified ear: Secondary | ICD-10-CM

## 2020-01-29 DIAGNOSIS — D485 Neoplasm of uncertain behavior of skin: Secondary | ICD-10-CM | POA: Diagnosis not present

## 2020-01-29 DIAGNOSIS — H60331 Swimmer's ear, right ear: Secondary | ICD-10-CM | POA: Diagnosis not present

## 2020-01-29 NOTE — Progress Notes (Signed)
HPI: [name] is a 54 y.o. female who presents is referred by [facility] for evaluation of chronic ear problems she has had for several weeks.  Especially the right ear has been giving her a lot of pain to the point where she has difficulty wearing her hearing aids.  She is worn hearing aids in both ears for several years.  She has very bad hearing loss.. She saw his PCP who gave her a shot of penicillin and treated with amoxicillin.  Past Medical History:  Diagnosis Date  . Depression   . Diabetes mellitus   . Hypertension   . Obesity   . PTSD (post-traumatic stress disorder)    Past Surgical History:  Procedure Laterality Date  . INDUCED ABORTION     Social History   Socioeconomic History  . Marital status: Single    Spouse name: Not on file  . Number of children: Not on file  . Years of education: Not on file  . Highest education level: Not on file  Occupational History  . Not on file  Tobacco Use  . Smoking status: Former Smoker    Quit date: 08/12/2016    Years since quitting: 3.4  . Smokeless tobacco: Never Used  Vaping Use  . Vaping Use: Never used  Substance and Sexual Activity  . Alcohol use: Yes    Comment: occasional  . Drug use: No    Types: Marijuana    Comment: quit first of the year  . Sexual activity: Yes    Birth control/protection: None  Other Topics Concern  . Not on file  Social History Narrative  . Not on file   Social Determinants of Health   Financial Resource Strain:   . Difficulty of Paying Living Expenses:   Food Insecurity:   . Worried About Charity fundraiser in the Last Year:   . Arboriculturist in the Last Year:   Transportation Needs:   . Film/video editor (Medical):   [name] Lack of Transportation (Non-Medical):   Physical Activity:   . Days of Exercise per Week:   . Minutes of Exercise per Session:   Stress:   . Feeling of Stress :   Social Connections:   . Frequency of Communication with Friends and  Family:   . Frequency of Social Gatherings with Friends and Family:   . Attends Religious Services:   . Active Member of Clubs or Organizations:   . Attends Archivist Meetings:   [name] Marital Status:    Family History  Problem Relation Age of Onset  . Diabetes Mother   . Depression Mother   . Hypertension Mother   . Stroke Paternal Grandmother   . Stroke Paternal Grandfather    Allergies  Allergen Reactions  . Latex Rash   Prior to Admission medications   Medication Sig Start Date End Date Taking? Authorizing Provider  cholecalciferol (VITAMIN D) 1000 UNITS tablet Take 1,000 Units by mouth daily.   Yes [provider]  FLUoxetine (PROZAC) 20 MG tablet Take 20 mg by mouth daily.   Yes [provider]  fluticasone (FLONASE) 50 MCG/ACT nasal spray Place 1 spray into both nostrils daily as needed for allergies or rhinitis. 08/14/16  Yes McClung, Angela M, PA-C  lisinopril-hydrochlorothiazide (ZESTORETIC) 20-12.5 MG tablet Take 1 tablet by mouth daily. 02/09/17  Yes Alfonse Spruce, FNP  metFORMIN (GLUCOPHAGE XR) 500 MG 24 hr tablet Take 1 tablet (500 mg total) by mouth daily  with breakfast. 02/09/17  Yes Hairston, Mandesia R, FNP  mometasone-formoterol (DULERA) 200-5 MCG/ACT AERO Inhale 2 puffs into the lungs 2 (two) times daily. 08/28/16  Yes Tresa Garter, MD     Positive ROS: Otherwise negative  All other systems have been reviewed and were otherwise negative with the exception of those mentioned in the HPI and as above.  Physical Exam: Constitutional: Alert, well-appearing, no acute distress Ears: External ears without lesions or tenderness.  Right external ear as well as ear canal is very erythematous and swollen consistent with acute and chronic external otitis.  It was very painful to remove the hearing aid.  After removing the hearing aid the ear canal was cleaned with suction and hydrogen peroxide.  I then applied Ciprodex, gentian violet and  CSF powder to the right ear.  The TM was intact and mobile.  But the ear canal was very swollen.  Left ear canal also was swollen with some debris in the ear canal that was cleaned with hydroperoxide and suction.  The left ear canal was not as tender and the TM was clear and after cleaning the ear canal I replaced the hearing aid in the left ear.  A culture was also obtained from the right ear canal prior to cleaning it.. Nasal: External nose without lesions. Clear nasal passages Oral: Lips and gums without lesions. Tongue and palate mucosa without lesions. Posterior oropharynx clear. Neck: No palpable adenopathy or masses Respiratory: Breathing comfortably  Skin: No facial/neck lesions or rash noted.  Cerumen impaction removal  Date/Time: 01/29/2020 5:48 PM Performed by: Rozetta Nunnery, MD Authorized by: Rozetta Nunnery, MD   Consent:    Consent obtained:  Verbal   Consent given by:  Patient   Risks discussed:  Pain and bleeding Procedure details:    Location:  L ear and R ear   Procedure type: suction   Post-procedure details:    Inspection:  TM intact   Hearing quality:  Improved   Patient tolerance of procedure:  Tolerated well, no immediate complications Comments:     Patient with severe right external otitis and mild left inflammatory changes.  Ear canals were cleaned with suction.  Applied gentian violet Ciprodex and CSF powder to the right ear and only CSF powder to the left ear.    Assessment: Acute right external otitis with mild inflammatory changes on the left side. Poor hearing with chronic hearing aid use bilaterally  Plan: Recommended leaving the right hearing aid out for 1 week. Placed her on either Ciprodex or Cortisporin otic suspension drops 4 to 5 drops twice daily for 1 week. I also prescribed Cipro 500 mg twice daily for 10 days. Recommended follow-up in 6 days for recheck however she is not sure if she will be in town.   Radene Journey,  MD   CC:

## 2020-02-02 LAB — AEROBIC CULTURE

## 2020-02-04 ENCOUNTER — Ambulatory Visit (INDEPENDENT_AMBULATORY_CARE_PROVIDER_SITE_OTHER): Payer: Medicare HMO | Admitting: Otolaryngology

## 2020-05-01 ENCOUNTER — Other Ambulatory Visit: Payer: Self-pay

## 2020-05-01 ENCOUNTER — Encounter (HOSPITAL_COMMUNITY): Payer: Self-pay

## 2020-05-01 ENCOUNTER — Emergency Department (HOSPITAL_COMMUNITY): Payer: Medicare HMO

## 2020-05-01 ENCOUNTER — Emergency Department (HOSPITAL_COMMUNITY)
Admission: EM | Admit: 2020-05-01 | Discharge: 2020-05-01 | Disposition: A | Discharge status: Home / Self Care | Payer: Medicare HMO | Attending: Emergency Medicine | Admitting: Emergency Medicine

## 2020-05-01 DIAGNOSIS — J45909 Unspecified asthma, uncomplicated: Secondary | ICD-10-CM | POA: Diagnosis not present

## 2020-05-01 DIAGNOSIS — Z20822 Contact with and (suspected) exposure to covid-19: Secondary | ICD-10-CM | POA: Insufficient documentation

## 2020-05-01 DIAGNOSIS — Z79899 Other long term (current) drug therapy: Secondary | ICD-10-CM | POA: Diagnosis not present

## 2020-05-01 DIAGNOSIS — Z7984 Long term (current) use of oral hypoglycemic drugs: Secondary | ICD-10-CM | POA: Insufficient documentation

## 2020-05-01 DIAGNOSIS — R0600 Dyspnea, unspecified: Secondary | ICD-10-CM | POA: Diagnosis present

## 2020-05-01 DIAGNOSIS — Z7951 Long term (current) use of inhaled steroids: Secondary | ICD-10-CM | POA: Diagnosis not present

## 2020-05-01 DIAGNOSIS — R0602 Shortness of breath: Secondary | ICD-10-CM | POA: Diagnosis not present

## 2020-05-01 DIAGNOSIS — I10 Essential (primary) hypertension: Secondary | ICD-10-CM | POA: Diagnosis not present

## 2020-05-01 DIAGNOSIS — R0902 Hypoxemia: Secondary | ICD-10-CM | POA: Diagnosis not present

## 2020-05-01 DIAGNOSIS — Z91040 Latex allergy status: Secondary | ICD-10-CM | POA: Insufficient documentation

## 2020-05-01 DIAGNOSIS — R059 Cough, unspecified: Secondary | ICD-10-CM | POA: Diagnosis not present

## 2020-05-01 DIAGNOSIS — Z87891 Personal history of nicotine dependence: Secondary | ICD-10-CM | POA: Diagnosis not present

## 2020-05-01 DIAGNOSIS — R000 Tachycardia, unspecified: Secondary | ICD-10-CM | POA: Diagnosis not present

## 2020-05-01 DIAGNOSIS — J80 Acute respiratory distress syndrome: Secondary | ICD-10-CM | POA: Diagnosis not present

## 2020-05-01 DIAGNOSIS — E119 Type 2 diabetes mellitus without complications: Secondary | ICD-10-CM | POA: Insufficient documentation

## 2020-05-01 DIAGNOSIS — Z20828 Contact with and (suspected) exposure to other viral communicable diseases: Secondary | ICD-10-CM | POA: Diagnosis not present

## 2020-05-01 LAB — CBC WITH DIFFERENTIAL/PLATELET
Abs Immature Granulocytes: 0.02 10*3/uL (ref 0.00–0.07)
Basophils Absolute: 0 10*3/uL (ref 0.0–0.1)
Basophils Relative: 1 %
Eosinophils Absolute: 0.3 10*3/uL (ref 0.0–0.5)
Eosinophils Relative: 3 %
HCT: 39.2 % (ref 36.0–46.0)
Hemoglobin: 12.7 g/dL (ref 12.0–15.0)
Immature Granulocytes: 0 %
Lymphocytes Relative: 11 %
Lymphs Abs: 0.9 10*3/uL (ref 0.7–4.0)
MCH: 30.4 pg (ref 26.0–34.0)
MCHC: 32.4 g/dL (ref 30.0–36.0)
MCV: 93.8 fL (ref 80.0–100.0)
Monocytes Absolute: 0.6 10*3/uL (ref 0.1–1.0)
Monocytes Relative: 8 %
Neutro Abs: 6.1 10*3/uL (ref 1.7–7.7)
Neutrophils Relative %: 77 %
Platelets: 201 10*3/uL (ref 150–400)
RBC: 4.18 MIL/uL (ref 3.87–5.11)
RDW: 13.2 % (ref 11.5–15.5)
WBC: 7.9 10*3/uL (ref 4.0–10.5)
nRBC: 0 % (ref 0.0–0.2)

## 2020-05-01 LAB — URINALYSIS, ROUTINE W REFLEX MICROSCOPIC
Bilirubin Urine: NEGATIVE
Glucose, UA: NEGATIVE mg/dL
Hgb urine dipstick: NEGATIVE
Ketones, ur: NEGATIVE mg/dL
Leukocytes,Ua: NEGATIVE
Nitrite: NEGATIVE
Protein, ur: NEGATIVE mg/dL
Specific Gravity, Urine: 1.005 (ref 1.005–1.030)
pH: 6 (ref 5.0–8.0)

## 2020-05-01 LAB — BRAIN NATRIURETIC PEPTIDE: B Natriuretic Peptide: 95.2 pg/mL (ref 0.0–100.0)

## 2020-05-01 LAB — COMPREHENSIVE METABOLIC PANEL
ALT: 18 U/L (ref 0–44)
AST: 17 U/L (ref 15–41)
Albumin: 4 g/dL (ref 3.5–5.0)
Alkaline Phosphatase: 59 U/L (ref 38–126)
Anion gap: 11 (ref 5–15)
BUN: 9 mg/dL (ref 6–20)
CO2: 23 mmol/L (ref 22–32)
Calcium: 8.6 mg/dL — ABNORMAL LOW (ref 8.9–10.3)
Chloride: 102 mmol/L (ref 98–111)
Creatinine, Ser: 0.81 mg/dL (ref 0.44–1.00)
GFR, Estimated: >60 mL/min (ref >60)
Glucose, Bld: 126 mg/dL — ABNORMAL HIGH (ref 70–99)
Potassium: 4 mmol/L (ref 3.5–5.1)
Sodium: 136 mmol/L (ref 135–145)
Total Bilirubin: 1.5 mg/dL — ABNORMAL HIGH (ref 0.3–1.2)
Total Protein: 6.6 g/dL (ref 6.5–8.1)

## 2020-05-01 LAB — RESP PANEL BY RT PCR (RSV, FLU A&B, COVID)
Influenza A by PCR: NEGATIVE
Influenza B by PCR: NEGATIVE
Respiratory Syncytial Virus by PCR: NEGATIVE
SARS Coronavirus 2 by RT PCR: NEGATIVE

## 2020-05-01 MED ORDER — PREDNISONE 20 MG PO TABS: 40.0000 mg | ORAL_TABLET | Freq: Every day | ORAL | 0 refills | Status: AC

## 2020-05-01 MED ORDER — IPRATROPIUM-ALBUTEROL 0.5-2.5 (3) MG/3ML IN SOLN
3.0000 mL | Freq: Once | RESPIRATORY_TRACT | Status: AC
  Administered 2020-05-01: 3 mL via RESPIRATORY_TRACT
  Filled 2020-05-01: qty 3

## 2020-05-01 MED ORDER — ALBUTEROL SULFATE HFA 108 (90 BASE) MCG/ACT IN AERS
2.0000 | INHALATION_SPRAY | Freq: Four times a day (QID) | RESPIRATORY_TRACT | Status: DC
  Administered 2020-05-01: 2 via RESPIRATORY_TRACT
  Filled 2020-05-01: qty 6.7

## 2020-05-01 NOTE — Discharge Instructions (Signed)
As discussed, today's episode was likely due at least in part to your history of smoking cigarettes, asthma, and possibly COPD, though you do not have that diagnosis formally.  It is important to take all medication as directed, including the prescribed steroids.  Use the provided albuterol inhaler every 4 hours for the next 2 days, then as needed.  Be sure to follow-up with your physician within 1 week for repeat evaluation.  When you see your physician, please discuss options for smoking cessation as well.  Return here for concerning changes in your condition.

## 2020-05-01 NOTE — ED Provider Notes (Signed)
[facility] DEPT Provider Note   CSN: [id number] Arrival date & time: 05/01/20  1842     History Chief Complaint  Patient presents with  . Shortness of Breath  . Cough    [name] is a 54 y.o. female.  HPI With history of asthma, cigarette addiction presents with dyspnea via ER MS. History is obtained by the patient and EMS providers. Onset was within the past 2 days, patient notes 1 prior possible exposure to Covid positive individual. Patient has had worsening dyspnea in spite of trying to smoke less. She notes that she has no money to afford rescue inhalers. EMS reports the patient improved with DuoNeb, Solu-Medrol, had mild hypoxia in route. Patient denies chest pain, abdominal pain, fever, vomiting.  Past Medical History:  Diagnosis Date  . Depression   . Diabetes mellitus   . Hypertension   . Obesity   . PTSD (post-traumatic stress disorder)     Patient Active Problem List   Diagnosis Date Noted  . Essential hypertension 01/15/2014  . Diabetes mellitus due to underlying condition without complications ([place]) 93/79/0240  . Smoking 01/15/2014  . GAD (generalized anxiety disorder) 08/03/2013  . Mood disorder ([place]) 08/03/2013  . MDD (major depressive disorder) 08/03/2013  . DYSMENORRHEA, SEVERE 11/16/2006  . SKIN RASH 11/16/2006  . DYSPNEA ON EXERTION 11/16/2006  . BACK PAIN 10/18/2006  . GENITAL HERPES 05/15/2006  . OBESITY, MORBID 05/15/2006  . ANXIETY 05/15/2006  . Depression 05/15/2006  . LOSS, SENSORINEURAL HEARING NOS 05/15/2006  . HYPERTENSION 05/15/2006  . ALLERGIC RHINITIS 05/15/2006  . GLUCOSE INTOLERANCE, HX OF 05/15/2006    Past Surgical History:  Procedure Laterality Date  . INDUCED ABORTION       OB History    Gravida  1   Para      Term      Preterm      AB  1   Living        SAB      TAB  1   Ectopic      Multiple      Live Births              Family History  Problem  Relation Age of Onset  . Diabetes Mother   . Depression Mother   . Hypertension Mother   . Stroke Paternal Grandmother   . Stroke Paternal Grandfather     Social History   Tobacco Use  . Smoking status: Former Smoker    Quit date: 08/12/2016    Years since quitting: 3.7  . Smokeless tobacco: Never Used  Vaping Use  . Vaping Use: Never used  Substance Use Topics  . Alcohol use: Yes    Comment: occasional  . Drug use: No    Types: Marijuana    Comment: quit first of the year    Home Medications Prior to Admission medications   Medication Sig Start Date End Date Taking? Authorizing Provider  cholecalciferol (VITAMIN D) 1000 UNITS tablet Take 1,000 Units by mouth daily.    [provider]  FLUoxetine (PROZAC) 20 MG tablet Take 20 mg by mouth daily.    [provider]  fluticasone (FLONASE) 50 MCG/ACT nasal spray Place 1 spray into both nostrils daily as needed for allergies or rhinitis. 08/14/16   Argentina Donovan, PA-C  lisinopril-hydrochlorothiazide (ZESTORETIC) 20-12.5 MG tablet Take 1 tablet by mouth daily. 02/09/17   Alfonse Spruce, FNP  metFORMIN (GLUCOPHAGE XR) 500 MG 24 hr  tablet Take 1 tablet (500 mg total) by mouth daily with breakfast. 02/09/17   Alfonse Spruce, FNP  mometasone-formoterol (DULERA) 200-5 MCG/ACT AERO Inhale 2 puffs into the lungs 2 (two) times daily. 08/28/16   Tresa Garter, MD    Allergies    Latex  Review of Systems   Review of Systems  Constitutional:       Per HPI, otherwise negative  HENT:       Per HPI, otherwise negative  Respiratory:       Per HPI, otherwise negative  Cardiovascular:       Per HPI, otherwise negative  Gastrointestinal: Negative for vomiting.  Endocrine:       Negative aside from HPI  Genitourinary:       Neg aside from HPI   Musculoskeletal:       Per HPI, otherwise negative  Skin: Negative.   Neurological: Negative for syncope.    Physical Exam Updated Vital Signs BP 137/79    Pulse 89   Temp 99.5 F (37.5 C) (Oral)   Resp 16   Ht 5\' 9"  (1.753 m)   Wt (!) 145.2 kg   SpO2 99%   BMI 47.26 kg/m   Physical Exam Vitals and nursing note reviewed.  Constitutional:      General: She is not in acute distress.    Appearance: She is well-developed.  HENT:     Head: Normocephalic and atraumatic.  Eyes:     Conjunctiva/sclera: Conjunctivae normal.  Cardiovascular:     Rate and Rhythm: Normal rate and regular rhythm.  Pulmonary:     Effort: Tachypnea and accessory muscle usage present.     Breath sounds: Decreased breath sounds and wheezing present.  Abdominal:     General: There is no distension.  Skin:    General: Skin is warm and dry.  Neurological:     Mental Status: She is alert and oriented to person, place, and time.     Cranial Nerves: No cranial nerve deficit.     ED Results / Procedures / Treatments   Labs (all labs ordered are listed, but only abnormal results are displayed) Labs Reviewed  COMPREHENSIVE METABOLIC PANEL - Abnormal; Notable for the following components:      Result Value   Glucose, Bld 126 (*)    Calcium 8.6 (*)    Total Bilirubin 1.5 (*)    All other components within normal limits  URINALYSIS, ROUTINE W REFLEX MICROSCOPIC - Abnormal; Notable for the following components:   Color, Urine STRAW (*)    All other components within normal limits  RESP PANEL BY RT PCR (RSV, FLU A&B, COVID)  CBC WITH DIFFERENTIAL/PLATELET  BRAIN NATRIURETIC PEPTIDE    EKG EKG Interpretation  Date/Time:  Saturday May 01 2020 19:17:19 EDT Ventricular Rate:  86 PR Interval:    QRS Duration: 76 QT Interval:  367 QTC Calculation: 439 R Axis:   76 Text Interpretation: Sinus rhythm Minimal ST depression, inferior leads unremarkable ecg Confirmed by Carmin Muskrat [phone]) on 05/01/2020 8:32:32 PM   Radiology DG Chest Port 1 View  Result Date: 05/01/2020 CLINICAL DATA:  Shortness of breath EXAM: PORTABLE CHEST 1 VIEW COMPARISON:   10/23/2017 FINDINGS: The heart size and mediastinal contours are within normal limits. Both lungs are clear. The visualized skeletal structures are unremarkable. IMPRESSION: No active disease. Electronically Signed   By: Constance Holster M.D.   On: 05/01/2020 20:13    Procedures Procedures (including critical care time)  Medications Ordered  in ED Medications  albuterol (VENTOLIN HFA) 108 (90 Base) MCG/ACT inhaler 2 puff (2 puffs Inhalation Given 05/01/20 2037)  ipratropium-albuterol (DUONEB) 0.5-2.5 (3) MG/3ML nebulizer solution 3 mL (3 mLs Nebulization Given 05/01/20 2136)    ED Course  I have reviewed the triage vital signs and the nursing notes.  Pertinent labs & imaging results that were available during my care of the patient were reviewed by me and considered in my medical decision making (see chart for details).   With consideration of pneumonia versus Covid versus asthma exacerbation secondary to the patient's smoking, x-ray, labs ordered, patient continue to receive oxygen, and bronchodilators.  10:14 PM Patient substantially better.  Mild wheezing, after albuterol, DuoNeb.  X-ray reviewed, no pneumonia, labs reviewed, no Covid, no substantial abnormalities. Now with improvement here, patient is appropriate for discharge with instructions on smoking cessation and ongoing therapy for likely COPD exacerbation.  Though she has no formal diagnosis of this, given her history of cigarettes, ongoing respiratory issues, suspicion exists. MDM Rules/Calculators/A&P MDM Number of Diagnoses or Management Options Shortness of breath: new, needed workup   Amount and/or Complexity of Data Reviewed Clinical lab tests: reviewed Tests in the radiology section of CPT: reviewed Tests in the medicine section of CPT: reviewed Obtain history from someone other than the patient: yes Independent visualization of images, tracings, or specimens: yes  Risk of Complications, Morbidity, and/or  Mortality Presenting problems: high Diagnostic procedures: high Management options: high  Critical Care Total time providing critical care: < 30 minutes  Patient Progress Patient progress: stable  Final Clinical Impression(s) / ED Diagnoses Final diagnoses:  Shortness of breath    Rx / DC Orders ED Discharge Orders         Ordered    predniSONE (DELTASONE) 20 MG tablet  Daily with breakfast        05/01/20 2219           Carmin Muskrat, MD 05/01/20 2219

## 2020-05-01 NOTE — ED Triage Notes (Signed)
Pt BIB EMS from home. Pt reports SHOB and cough that started a few days ago. Hx of asthma and smoking. Pt tested for COVID today but results pending. A&O x4.  92% RA  96% 3L  20G LH Duoneb given Solumedrol 125mg  109/47 CBG 122

## 2020-05-06 DIAGNOSIS — I10 Essential (primary) hypertension: Secondary | ICD-10-CM | POA: Diagnosis not present

## 2020-05-06 DIAGNOSIS — R062 Wheezing: Secondary | ICD-10-CM | POA: Diagnosis not present

## 2020-05-06 DIAGNOSIS — J189 Pneumonia, unspecified organism: Secondary | ICD-10-CM | POA: Diagnosis not present

## 2020-05-06 DIAGNOSIS — J441 Chronic obstructive pulmonary disease with (acute) exacerbation: Secondary | ICD-10-CM | POA: Diagnosis not present

## 2020-05-28 ENCOUNTER — Ambulatory Visit: Payer: Medicare HMO | Attending: Internal Medicine

## 2020-05-28 DIAGNOSIS — Z23 Encounter for immunization: Secondary | ICD-10-CM

## 2020-05-28 NOTE — Progress Notes (Signed)
   Covid-19 Vaccination Clinic  Name:  [name]    MRN: [id number] DOB: 1966-02-08  05/28/2020  [name] was observed post Covid-19 immunization for 15 minutes without incident. She was provided with Vaccine Information Sheet and instruction to access the V-Safe system.   [name] was instructed to call 911 with any severe reactions post vaccine: [name] Difficulty breathing  . Swelling of face and throat  . A fast heartbeat  . A bad rash all over body  . Dizziness and weakness   Immunizations Administered    Name Date Dose VIS Date Route   Pfizer COVID-19 Vaccine 05/28/2020  4:56 PM 0.3 mL 04/14/2020 Intramuscular   Manufacturer: [place]   Lot: [id number]   NDC: [address]-0

## 2020-07-12 ENCOUNTER — Emergency Department (HOSPITAL_COMMUNITY): Payer: Medicare HMO

## 2020-07-12 ENCOUNTER — Emergency Department (HOSPITAL_COMMUNITY)
Admission: EM | Admit: 2020-07-12 | Discharge: 2020-07-12 | Disposition: A | Discharge status: Home / Self Care | Payer: Medicare HMO | Attending: Emergency Medicine | Admitting: Emergency Medicine

## 2020-07-12 ENCOUNTER — Other Ambulatory Visit: Payer: Self-pay

## 2020-07-12 ENCOUNTER — Other Ambulatory Visit: Payer: Self-pay | Admitting: Emergency Medicine

## 2020-07-12 ENCOUNTER — Encounter (HOSPITAL_COMMUNITY): Payer: Self-pay | Admitting: Emergency Medicine

## 2020-07-12 DIAGNOSIS — I10 Essential (primary) hypertension: Secondary | ICD-10-CM | POA: Insufficient documentation

## 2020-07-12 DIAGNOSIS — Z87891 Personal history of nicotine dependence: Secondary | ICD-10-CM | POA: Insufficient documentation

## 2020-07-12 DIAGNOSIS — Z79899 Other long term (current) drug therapy: Secondary | ICD-10-CM | POA: Insufficient documentation

## 2020-07-12 DIAGNOSIS — R1111 Vomiting without nausea: Secondary | ICD-10-CM | POA: Diagnosis not present

## 2020-07-12 DIAGNOSIS — Z7984 Long term (current) use of oral hypoglycemic drugs: Secondary | ICD-10-CM | POA: Diagnosis not present

## 2020-07-12 DIAGNOSIS — G8929 Other chronic pain: Secondary | ICD-10-CM | POA: Diagnosis not present

## 2020-07-12 DIAGNOSIS — R112 Nausea with vomiting, unspecified: Secondary | ICD-10-CM | POA: Insufficient documentation

## 2020-07-12 DIAGNOSIS — M546 Pain in thoracic spine: Secondary | ICD-10-CM | POA: Diagnosis not present

## 2020-07-12 DIAGNOSIS — Z91040 Latex allergy status: Secondary | ICD-10-CM | POA: Insufficient documentation

## 2020-07-12 DIAGNOSIS — Z743 Need for continuous supervision: Secondary | ICD-10-CM | POA: Diagnosis not present

## 2020-07-12 DIAGNOSIS — R52 Pain, unspecified: Secondary | ICD-10-CM | POA: Diagnosis not present

## 2020-07-12 DIAGNOSIS — M545 Low back pain, unspecified: Secondary | ICD-10-CM | POA: Diagnosis not present

## 2020-07-12 DIAGNOSIS — R109 Unspecified abdominal pain: Secondary | ICD-10-CM | POA: Insufficient documentation

## 2020-07-12 DIAGNOSIS — J189 Pneumonia, unspecified organism: Secondary | ICD-10-CM

## 2020-07-12 DIAGNOSIS — E119 Type 2 diabetes mellitus without complications: Secondary | ICD-10-CM | POA: Diagnosis not present

## 2020-07-12 DIAGNOSIS — R059 Cough, unspecified: Secondary | ICD-10-CM | POA: Diagnosis not present

## 2020-07-12 DIAGNOSIS — M549 Dorsalgia, unspecified: Secondary | ICD-10-CM | POA: Diagnosis not present

## 2020-07-12 DIAGNOSIS — J181 Lobar pneumonia, unspecified organism: Secondary | ICD-10-CM | POA: Insufficient documentation

## 2020-07-12 MED ORDER — DOXYCYCLINE HYCLATE 100 MG PO CAPS: 100.0000 mg | ORAL_CAPSULE | Freq: Two times a day (BID) | ORAL | 0 refills | Status: AC

## 2020-07-12 MED ORDER — CEFPODOXIME PROXETIL 200 MG PO TABS: 200.0000 mg | ORAL_TABLET | Freq: Two times a day (BID) | ORAL | 0 refills | Status: DC

## 2020-07-12 MED ORDER — KETOROLAC TROMETHAMINE 60 MG/2ML IM SOLN
60.0000 mg | Freq: Once | INTRAMUSCULAR | Status: AC
  Administered 2020-07-12: 60 mg via INTRAMUSCULAR
  Filled 2020-07-12: qty 2

## 2020-07-12 MED ORDER — DOXYCYCLINE HYCLATE 100 MG PO CAPS: 100.0000 mg | ORAL_CAPSULE | Freq: Two times a day (BID) | ORAL | 0 refills | Status: DC

## 2020-07-12 MED ORDER — ONDANSETRON HCL 4 MG PO TABS
4.0000 mg | ORAL_TABLET | Freq: Once | ORAL | Status: AC
  Administered 2020-07-12: 4 mg via ORAL
  Filled 2020-07-12: qty 1

## 2020-07-12 MED FILL — DOXYCYCLINE HYCLATE 100 MG: 100 | 7 days supply | Qty: 14 | Fill #0

## 2020-07-12 MED FILL — CEFPODOXIME 200 MG TABLET: 200 | 7 days supply | Qty: 14 | Fill #0

## 2020-07-12 NOTE — ED Provider Notes (Signed)
[facility] DEPT Provider Note   CSN: [id number] Arrival date & time: 07/12/20  0347     History Chief Complaint  Patient presents with  . Back Pain    [name] is a 55 y.o. female diabetes, hypertension, depression, anxiety, obesity.  Patient presents with a chief complaint of back pain.  Patient reports that she has had this same pattern of back pain for the past "couple years."  Over that time patient has an episode of back pain at least once a month.  Patient reports that usually her pain lasts for hours and resolves.  Patient presents today with 3 days of back pain.  States pain is located between her shoulder blades, constant pain, rates 10/10 on the pain scale, worse with coughing, breathing, moving, laying in bed; no alleviating factors.  Patient states that today her back pain resolved and is now located in her her left flank.  Patient reports that this change in pain loction has occurred with previous episodes of her back pain.  Patient reports nausea and vomiting.  Patient reports vomiting once in the last 24 hours, denies any blood or coffee-ground emesis.  Patient denies any recent falls, injuries, or heavy lifting.  Patient denies any fevers, chills, shortness of breath, chest pain, leg swelling, bowel or bladder incontinence or retention, numbness or tingling in extremities, dysuria.    HPI     Past Medical History:  Diagnosis Date  . Depression   . Diabetes mellitus   . Hypertension   . Obesity   . PTSD (post-traumatic stress disorder)     Patient Active Problem List   Diagnosis Date Noted  . Essential hypertension 01/15/2014  . Diabetes mellitus due to underlying condition without complications ([place]) 99991111  . Smoking 01/15/2014  . GAD (generalized anxiety disorder) 08/03/2013  . Mood disorder ([place]) 08/03/2013  . MDD (major depressive disorder) 08/03/2013  . DYSMENORRHEA, SEVERE 11/16/2006  . SKIN RASH 11/16/2006  .  DYSPNEA ON EXERTION 11/16/2006  . BACK PAIN 10/18/2006  . GENITAL HERPES 05/15/2006  . OBESITY, MORBID 05/15/2006  . ANXIETY 05/15/2006  . Depression 05/15/2006  . LOSS, SENSORINEURAL HEARING NOS 05/15/2006  . HYPERTENSION 05/15/2006  . ALLERGIC RHINITIS 05/15/2006  . GLUCOSE INTOLERANCE, HX OF 05/15/2006    Past Surgical History:  Procedure Laterality Date  . INDUCED ABORTION       OB History    Gravida  1   Para      Term      Preterm      AB  1   Living        SAB      IAB  1   Ectopic      Multiple      Live Births              Family History  Problem Relation Age of Onset  . Diabetes Mother   . Depression Mother   . Hypertension Mother   . Stroke Paternal Grandmother   . Stroke Paternal Grandfather     Social History   Tobacco Use  . Smoking status: Former Smoker    Quit date: 08/12/2016    Years since quitting: 3.9  . Smokeless tobacco: Never Used  Vaping Use  . Vaping Use: Never used  Substance Use Topics  . Alcohol use: Yes    Comment: occasional  . Drug use: No    Types: Marijuana    Comment: quit first of the year  Home Medications Prior to Admission medications   Medication Sig Start Date End Date Taking? Authorizing Provider  cefpodoxime (VANTIN) 200 MG tablet Take 1 tablet (200 mg total) by mouth 2 (two) times daily for 7 days. 07/12/20 07/19/20  Loni Beckwith, PA-C  cholecalciferol (VITAMIN D) 1000 UNITS tablet Take 1,000 Units by mouth daily.    [provider]  doxycycline (VIBRAMYCIN) 100 MG capsule Take 1 capsule (100 mg total) by mouth 2 (two) times daily for 7 days. 07/12/20 07/19/20  Loni Beckwith, PA-C  FLUoxetine (PROZAC) 20 MG tablet Take 20 mg by mouth daily.    [provider]  fluticasone (FLONASE) 50 MCG/ACT nasal spray Place 1 spray into both nostrils daily as needed for allergies or rhinitis. 08/14/16   Argentina Donovan, PA-C  lisinopril-hydrochlorothiazide (ZESTORETIC) 20-12.5  MG tablet Take 1 tablet by mouth daily. 02/09/17   Alfonse Spruce, FNP  metFORMIN (GLUCOPHAGE XR) 500 MG 24 hr tablet Take 1 tablet (500 mg total) by mouth daily with breakfast. 02/09/17   Alfonse Spruce, FNP  mometasone-formoterol (DULERA) 200-5 MCG/ACT AERO Inhale 2 puffs into the lungs 2 (two) times daily. 08/28/16   Tresa Garter, MD  predniSONE (DELTASONE) 20 MG tablet Take 2 tablets (40 mg total) by mouth daily with breakfast. For the next four days 05/01/20   Carmin Muskrat, MD    Allergies    Latex  Review of Systems   Review of Systems  Constitutional: Negative for chills and fever.  Eyes: Negative for visual disturbance.  Respiratory: Positive for cough. Negative for shortness of breath.   Cardiovascular: Negative for chest pain and leg swelling.  Gastrointestinal: Positive for abdominal pain, nausea and vomiting. Negative for abdominal distention, blood in stool, constipation and diarrhea.  Genitourinary: Negative for difficulty urinating and dysuria.  Musculoskeletal: Positive for back pain. Negative for neck pain.  Skin: Negative for color change and rash.  Neurological: Negative for dizziness, syncope, light-headedness and headaches.  Psychiatric/Behavioral: Negative for confusion.    Physical Exam Updated Vital Signs BP (!) 152/84 (BP Location: Left Arm)   Pulse 79   Temp 98.5 F (36.9 C) (Oral)   Resp 18   Ht 5\' 9"  (1.753 m)   Wt (!) 145.2 kg   SpO2 97%   BMI 47.26 kg/m   Physical Exam Vitals and nursing note reviewed.  Constitutional:      General: She is not in acute distress.    Appearance: She is obese. She is not ill-appearing, toxic-appearing or diaphoretic.  HENT:     Head: Normocephalic.  Eyes:     General: No scleral icterus.       Right eye: No discharge.        Left eye: No discharge.  Cardiovascular:     Rate and Rhythm: Normal rate and regular rhythm.     Heart sounds: Normal heart sounds.  Pulmonary:     Effort:  Pulmonary effort is normal. No respiratory distress.     Breath sounds: Normal breath sounds. No stridor. No wheezing, rhonchi or rales.  Abdominal:     General: There is no distension.     Palpations: Abdomen is soft. There is no mass or pulsatile mass.     Tenderness: There is no right CVA tenderness, left CVA tenderness, guarding or rebound. Negative signs include McBurney's sign.     Comments: Tenderness to left flank  Musculoskeletal:     Cervical back: Neck supple. No deformity, tenderness or bony tenderness.  No spinous process tenderness or muscular tenderness.     Thoracic back: No deformity, tenderness or bony tenderness.     Lumbar back: No deformity, tenderness or bony tenderness.     Right lower leg: No tenderness. No edema.     Left lower leg: No tenderness. No edema.  Skin:    General: Skin is warm and dry.  Neurological:     General: No focal deficit present.     Mental Status: She is alert.  Psychiatric:        Behavior: Behavior is cooperative.     ED Results / Procedures / Treatments   Labs (all labs ordered are listed, but only abnormal results are displayed) Labs Reviewed - No data to display  EKG None  Radiology DG Chest Portable 1 View  Result Date: 07/12/2020 CLINICAL DATA:  Cough EXAM: PORTABLE CHEST 1 VIEW COMPARISON:  May 01, 2020 FINDINGS: There is subtle opacity in the medial right base. Lungs elsewhere clear. Heart size and pulmonary vascularity are normal. No adenopathy. No bone lesions. IMPRESSION: Subtle increased opacity medial right base. Suspect developing focal pneumonia in this area. Lungs elsewhere clear. Cardiac silhouette normal. Electronically Signed   By: Lowella Grip III M.D.   On: 07/12/2020 11:43    Procedures Procedures (including critical care time)  Medications Ordered in ED Medications  ketorolac (TORADOL) injection 60 mg (60 mg Intramuscular Given 07/12/20 1131)  ondansetron (ZOFRAN) tablet 4 mg (4 mg Oral Given  07/12/20 1141)    ED Course  I have reviewed the triage vital signs and the nursing notes.  Pertinent labs & imaging results that were available during my care of the patient were reviewed by me and considered in my medical decision making (see chart for details).    MDM Rules/Calculators/A&P                          Alert 55 year old female in no acute distress, nontoxic appearing.  Presents with chief complaint of 3 days of thoracic back pain.  Pain in her back has resolved and now complaining of pain in left flank pain.  Patient reports that she has similar episodes of back pain once a month for the last "couple years."  Patient reports that she has had similar movement of pain to left flank in the past.    Patient denies IV drug use, no fevers or chills; less concerning for epidural abscess.  Patient denies bowel or bladder dysfunction, no numbness or tingling in extremities no focal neurological deficits noted, patient able to ambulate without difficulty; less concerning for cauda equina syndrome.  Patient denies any recent surgery, past or present cancer diagnosis, imbolization, history of DVT or PE, hemoptysis, hormone therapy; less concerning for PE.  Chest x-ray shows no pneumothorax.  Patient denies any chest pain, shortness of breath, change in symptoms with exertion; less concerning for ACS.  Chest x-ray shows Subtle increased opacity medial right base. Suspect developing focal pneumonia in this area.  Patient prescribed cefpodoxime and doxycycline for community-acquired pneumonia.  These medications were chosen to treat CAP due to patient's history of diabetes.  Patient was given Toradol and Zofran.  Patient reports resolution of her pain and nausea.  Discussed results, findings, treatment and follow up. Patient advised of return precautions. Patient verbalized understanding and agreed with plan.     Final Clinical Impression(s) / ED Diagnoses Final diagnoses:  Community acquired  pneumonia of right lower lobe of lung  Chronic midline thoracic back pain    Rx / DC Orders ED Discharge Orders         Ordered    cefpodoxime (VANTIN) 200 MG tablet  2 times daily,   Status:  Discontinued        07/12/20 1207    doxycycline (VIBRAMYCIN) 100 MG capsule  2 times daily,   Status:  Discontinued        07/12/20 1207    doxycycline (VIBRAMYCIN) 100 MG capsule  2 times daily        07/12/20 1238    cefpodoxime (VANTIN) 200 MG tablet  2 times daily        07/12/20 1238           Loni Beckwith, PA-C 07/12/20 [place], [place], DO 07/13/20 [phone]

## 2020-07-12 NOTE — ED Triage Notes (Signed)
Pt BIB [facility] from home with L lower stabbing back pain x 3 days. Also reports N/V with the back pain. Tried flexeril and tylenol without relief. Reports a similar episode a few months ago. Wears hearing aids.

## 2020-07-12 NOTE — Discharge Instructions (Addendum)
You came to the emergency room today to be evaluated for your back pain.  Your physical exam was reassuring.  Your chest x-ray showed pneumonia in your right lung; therefore you were started on antibiotics.  Please take antibiotics as prescribed.    You may have diarrhea from the antibiotics.  It is very important that you continue to take the antibiotics even if you get diarrhea unless a medical professional tells you that you may stop taking them.  If you stop too early the bacteria you are being treated for will become stronger and you may need different, more powerful antibiotics that have more side effects and worsening diarrhea.  Please stay well hydrated and consider probiotics as they may decrease the severity of your diarrhea.  Please be aware that if you take any hormonal contraception (birth control pills, nexplanon, the ring, etc) that your birth control will not work while you are taking antibiotics and you need to use back up protection as directed on the birth control medication information insert.    Get help right away if: You have weakness or numbness in one or both of your legs or feet. You have trouble controlling your bladder or your bowels. You develop shortness of breath You cough up blood You have severe back pain and have any of the following: Nausea or vomiting. Pain in your abdomen. Shortness of breath or you faint.

## 2020-07-22 DIAGNOSIS — I10 Essential (primary) hypertension: Secondary | ICD-10-CM | POA: Diagnosis not present

## 2020-07-22 DIAGNOSIS — J441 Chronic obstructive pulmonary disease with (acute) exacerbation: Secondary | ICD-10-CM | POA: Diagnosis not present

## 2020-07-22 DIAGNOSIS — R062 Wheezing: Secondary | ICD-10-CM | POA: Diagnosis not present

## 2020-10-21 DIAGNOSIS — E119 Type 2 diabetes mellitus without complications: Secondary | ICD-10-CM | POA: Diagnosis not present

## 2020-10-21 DIAGNOSIS — I10 Essential (primary) hypertension: Secondary | ICD-10-CM | POA: Diagnosis not present

## 2020-10-21 DIAGNOSIS — E559 Vitamin D deficiency, unspecified: Secondary | ICD-10-CM | POA: Diagnosis not present

## 2020-10-21 DIAGNOSIS — N393 Stress incontinence (female) (male): Secondary | ICD-10-CM | POA: Diagnosis not present

## 2020-10-21 DIAGNOSIS — R69 Illness, unspecified: Secondary | ICD-10-CM | POA: Diagnosis not present

## 2020-10-21 DIAGNOSIS — M5416 Radiculopathy, lumbar region: Secondary | ICD-10-CM | POA: Diagnosis not present

## 2020-10-22 DIAGNOSIS — E119 Type 2 diabetes mellitus without complications: Secondary | ICD-10-CM | POA: Diagnosis not present

## 2020-10-22 DIAGNOSIS — R531 Weakness: Secondary | ICD-10-CM | POA: Diagnosis not present

## 2020-10-22 DIAGNOSIS — R42 Dizziness and giddiness: Secondary | ICD-10-CM | POA: Diagnosis not present

## 2020-10-22 DIAGNOSIS — I10 Essential (primary) hypertension: Secondary | ICD-10-CM | POA: Diagnosis not present

## 2020-10-22 DIAGNOSIS — J45909 Unspecified asthma, uncomplicated: Secondary | ICD-10-CM | POA: Diagnosis not present

## 2020-10-22 DIAGNOSIS — R69 Illness, unspecified: Secondary | ICD-10-CM | POA: Diagnosis not present

## 2020-12-07 DIAGNOSIS — H5203 Hypermetropia, bilateral: Secondary | ICD-10-CM | POA: Diagnosis not present

## 2021-03-15 DIAGNOSIS — R69 Illness, unspecified: Secondary | ICD-10-CM | POA: Diagnosis not present

## 2021-03-15 DIAGNOSIS — M5416 Radiculopathy, lumbar region: Secondary | ICD-10-CM | POA: Diagnosis not present

## 2021-03-15 DIAGNOSIS — E559 Vitamin D deficiency, unspecified: Secondary | ICD-10-CM | POA: Diagnosis not present

## 2021-03-15 DIAGNOSIS — Z23 Encounter for immunization: Secondary | ICD-10-CM | POA: Diagnosis not present

## 2021-03-15 DIAGNOSIS — Z0001 Encounter for general adult medical examination with abnormal findings: Secondary | ICD-10-CM | POA: Diagnosis not present

## 2021-03-15 DIAGNOSIS — N393 Stress incontinence (female) (male): Secondary | ICD-10-CM | POA: Diagnosis not present

## 2021-03-15 DIAGNOSIS — I10 Essential (primary) hypertension: Secondary | ICD-10-CM | POA: Diagnosis not present

## 2021-03-25 DIAGNOSIS — H6063 Unspecified chronic otitis externa, bilateral: Secondary | ICD-10-CM | POA: Diagnosis not present

## 2021-03-25 DIAGNOSIS — Z974 Presence of external hearing-aid: Secondary | ICD-10-CM | POA: Diagnosis not present

## 2021-03-25 DIAGNOSIS — H61893 Other specified disorders of external ear, bilateral: Secondary | ICD-10-CM | POA: Diagnosis not present

## 2021-03-25 DIAGNOSIS — Z87891 Personal history of nicotine dependence: Secondary | ICD-10-CM | POA: Diagnosis not present

## 2021-03-25 DIAGNOSIS — H903 Sensorineural hearing loss, bilateral: Secondary | ICD-10-CM | POA: Diagnosis not present

## 2021-03-25 DIAGNOSIS — H9193 Unspecified hearing loss, bilateral: Secondary | ICD-10-CM | POA: Diagnosis not present

## 2021-03-28 DIAGNOSIS — H6063 Unspecified chronic otitis externa, bilateral: Secondary | ICD-10-CM | POA: Diagnosis not present

## 2021-03-30 ENCOUNTER — Other Ambulatory Visit: Payer: Self-pay | Admitting: Family Medicine

## 2021-03-30 DIAGNOSIS — Z1231 Encounter for screening mammogram for malignant neoplasm of breast: Secondary | ICD-10-CM

## 2021-04-01 DIAGNOSIS — E119 Type 2 diabetes mellitus without complications: Secondary | ICD-10-CM | POA: Diagnosis not present

## 2021-04-01 DIAGNOSIS — J441 Chronic obstructive pulmonary disease with (acute) exacerbation: Secondary | ICD-10-CM | POA: Diagnosis not present

## 2021-04-01 DIAGNOSIS — R062 Wheezing: Secondary | ICD-10-CM | POA: Diagnosis not present

## 2021-04-01 DIAGNOSIS — R051 Acute cough: Secondary | ICD-10-CM | POA: Diagnosis not present

## 2021-04-01 DIAGNOSIS — Z20822 Contact with and (suspected) exposure to covid-19: Secondary | ICD-10-CM | POA: Diagnosis not present

## 2021-05-03 DIAGNOSIS — L900 Lichen sclerosus et atrophicus: Secondary | ICD-10-CM | POA: Diagnosis not present

## 2021-05-03 DIAGNOSIS — B372 Candidiasis of skin and nail: Secondary | ICD-10-CM | POA: Diagnosis not present

## 2021-05-03 DIAGNOSIS — N393 Stress incontinence (female) (male): Secondary | ICD-10-CM | POA: Diagnosis not present

## 2021-05-03 DIAGNOSIS — I10 Essential (primary) hypertension: Secondary | ICD-10-CM | POA: Diagnosis not present

## 2021-05-27 DIAGNOSIS — Z23 Encounter for immunization: Secondary | ICD-10-CM | POA: Diagnosis not present

## 2021-05-27 DIAGNOSIS — N3946 Mixed incontinence: Secondary | ICD-10-CM | POA: Diagnosis not present

## 2021-05-27 DIAGNOSIS — I10 Essential (primary) hypertension: Secondary | ICD-10-CM | POA: Diagnosis not present

## 2021-05-27 DIAGNOSIS — E119 Type 2 diabetes mellitus without complications: Secondary | ICD-10-CM | POA: Diagnosis not present

## 2021-05-27 DIAGNOSIS — M545 Low back pain, unspecified: Secondary | ICD-10-CM | POA: Diagnosis not present

## 2021-07-07 DIAGNOSIS — E119 Type 2 diabetes mellitus without complications: Secondary | ICD-10-CM | POA: Diagnosis not present

## 2021-07-07 DIAGNOSIS — R7301 Impaired fasting glucose: Secondary | ICD-10-CM | POA: Diagnosis not present

## 2021-07-07 DIAGNOSIS — E039 Hypothyroidism, unspecified: Secondary | ICD-10-CM | POA: Diagnosis not present

## 2021-07-07 DIAGNOSIS — I10 Essential (primary) hypertension: Secondary | ICD-10-CM | POA: Diagnosis not present

## 2021-07-07 DIAGNOSIS — Z0001 Encounter for general adult medical examination with abnormal findings: Secondary | ICD-10-CM | POA: Diagnosis not present

## 2021-07-07 DIAGNOSIS — E559 Vitamin D deficiency, unspecified: Secondary | ICD-10-CM | POA: Diagnosis not present

## 2021-07-07 DIAGNOSIS — R69 Illness, unspecified: Secondary | ICD-10-CM | POA: Diagnosis not present

## 2021-07-07 DIAGNOSIS — E785 Hyperlipidemia, unspecified: Secondary | ICD-10-CM | POA: Diagnosis not present

## 2021-11-10 DIAGNOSIS — Z20822 Contact with and (suspected) exposure to covid-19: Secondary | ICD-10-CM | POA: Diagnosis not present

## 2021-11-10 DIAGNOSIS — E119 Type 2 diabetes mellitus without complications: Secondary | ICD-10-CM | POA: Diagnosis not present

## 2021-11-10 DIAGNOSIS — I10 Essential (primary) hypertension: Secondary | ICD-10-CM | POA: Diagnosis not present

## 2021-11-10 DIAGNOSIS — R69 Illness, unspecified: Secondary | ICD-10-CM | POA: Diagnosis not present

## 2021-11-10 DIAGNOSIS — J441 Chronic obstructive pulmonary disease with (acute) exacerbation: Secondary | ICD-10-CM | POA: Diagnosis not present

## 2021-11-17 DIAGNOSIS — N3946 Mixed incontinence: Secondary | ICD-10-CM | POA: Diagnosis not present

## 2021-11-17 DIAGNOSIS — I10 Essential (primary) hypertension: Secondary | ICD-10-CM | POA: Diagnosis not present

## 2021-11-17 DIAGNOSIS — R69 Illness, unspecified: Secondary | ICD-10-CM | POA: Diagnosis not present

## 2021-11-17 DIAGNOSIS — Z0000 Encounter for general adult medical examination without abnormal findings: Secondary | ICD-10-CM | POA: Diagnosis not present

## 2021-11-17 DIAGNOSIS — Z6841 Body Mass Index (BMI) 40.0 and over, adult: Secondary | ICD-10-CM | POA: Diagnosis not present

## 2021-11-17 DIAGNOSIS — E559 Vitamin D deficiency, unspecified: Secondary | ICD-10-CM | POA: Diagnosis not present

## 2021-11-17 DIAGNOSIS — J449 Chronic obstructive pulmonary disease, unspecified: Secondary | ICD-10-CM | POA: Diagnosis not present

## 2022-01-05 IMAGING — DX DG CHEST 1V PORT
1 series · 1 of 1 positions shown · non-contrast
Comparison: May 01, 2020

CLINICAL DATA: Cough

EXAM:
PORTABLE CHEST 1 VIEW

[chest ap]
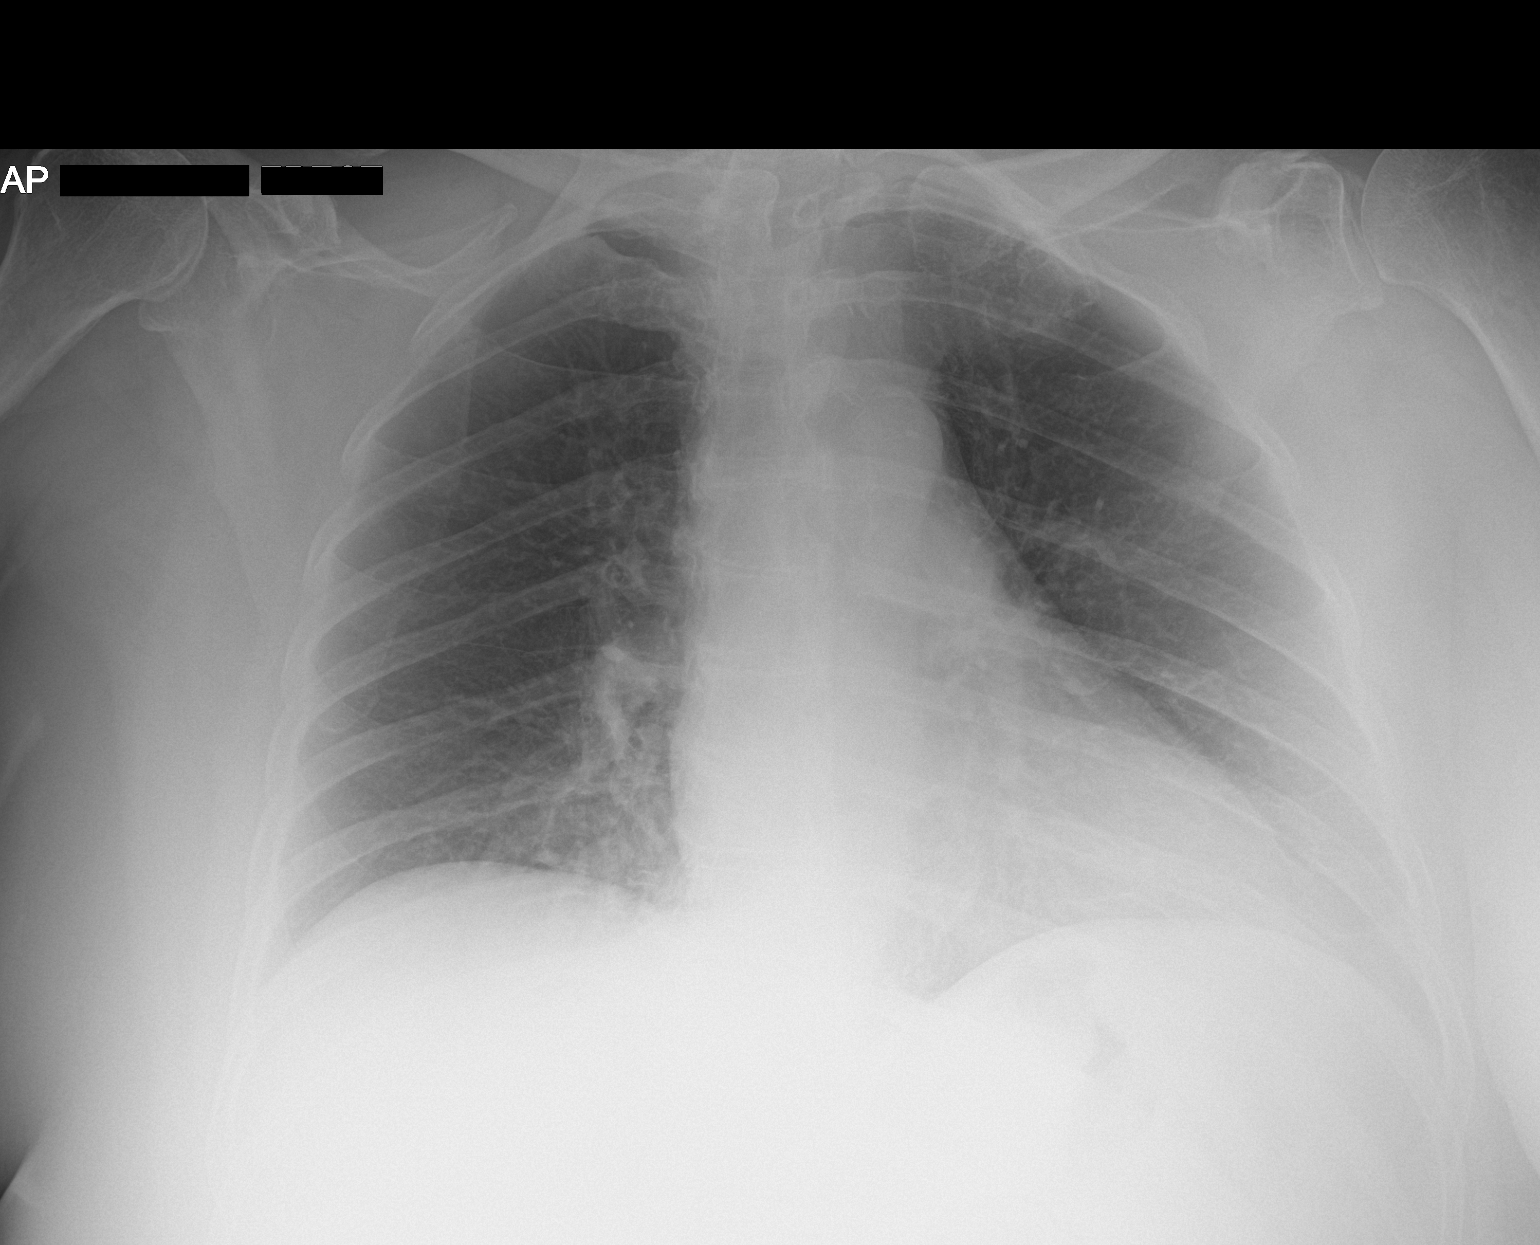

[1 of 1 positions shown; findings below may reference images not displayed]

FINDINGS: There is subtle opacity in the medial right base. Lungs elsewhere
clear. Heart size and pulmonary vascularity are normal. No
adenopathy. No bone lesions.
IMPRESSION: Subtle increased opacity medial right base. Suspect developing focal
pneumonia in this area. Lungs elsewhere clear. Cardiac silhouette
normal.

## 2022-03-02 DIAGNOSIS — G251 Drug-induced tremor: Secondary | ICD-10-CM | POA: Diagnosis not present

## 2022-03-02 DIAGNOSIS — Z0001 Encounter for general adult medical examination with abnormal findings: Secondary | ICD-10-CM | POA: Diagnosis not present

## 2022-03-02 DIAGNOSIS — E559 Vitamin D deficiency, unspecified: Secondary | ICD-10-CM | POA: Diagnosis not present

## 2022-03-02 DIAGNOSIS — E119 Type 2 diabetes mellitus without complications: Secondary | ICD-10-CM | POA: Diagnosis not present

## 2022-03-02 DIAGNOSIS — I10 Essential (primary) hypertension: Secondary | ICD-10-CM | POA: Diagnosis not present

## 2022-03-30 DIAGNOSIS — R69 Illness, unspecified: Secondary | ICD-10-CM | POA: Diagnosis not present

## 2022-03-30 DIAGNOSIS — J449 Chronic obstructive pulmonary disease, unspecified: Secondary | ICD-10-CM | POA: Diagnosis not present

## 2022-03-30 DIAGNOSIS — I10 Essential (primary) hypertension: Secondary | ICD-10-CM | POA: Diagnosis not present

## 2022-05-08 DIAGNOSIS — J449 Chronic obstructive pulmonary disease, unspecified: Secondary | ICD-10-CM | POA: Diagnosis not present

## 2022-05-08 DIAGNOSIS — I10 Essential (primary) hypertension: Secondary | ICD-10-CM | POA: Diagnosis not present

## 2022-05-08 DIAGNOSIS — R69 Illness, unspecified: Secondary | ICD-10-CM | POA: Diagnosis not present

## 2022-08-14 DIAGNOSIS — J441 Chronic obstructive pulmonary disease with (acute) exacerbation: Secondary | ICD-10-CM | POA: Diagnosis not present

## 2022-08-14 DIAGNOSIS — Z0000 Encounter for general adult medical examination without abnormal findings: Secondary | ICD-10-CM | POA: Diagnosis not present

## 2022-08-14 DIAGNOSIS — E119 Type 2 diabetes mellitus without complications: Secondary | ICD-10-CM | POA: Diagnosis not present

## 2022-08-14 DIAGNOSIS — I10 Essential (primary) hypertension: Secondary | ICD-10-CM | POA: Diagnosis not present

## 2022-08-14 DIAGNOSIS — R69 Illness, unspecified: Secondary | ICD-10-CM | POA: Diagnosis not present

## 2022-09-10 DIAGNOSIS — U071 COVID-19: Secondary | ICD-10-CM | POA: Diagnosis not present

## 2022-09-10 DIAGNOSIS — R059 Cough, unspecified: Secondary | ICD-10-CM | POA: Diagnosis not present

## 2022-09-10 DIAGNOSIS — R0981 Nasal congestion: Secondary | ICD-10-CM | POA: Diagnosis not present

## 2022-09-10 DIAGNOSIS — R062 Wheezing: Secondary | ICD-10-CM | POA: Diagnosis not present

## 2022-09-13 DIAGNOSIS — R051 Acute cough: Secondary | ICD-10-CM | POA: Diagnosis not present

## 2022-09-13 DIAGNOSIS — U071 COVID-19: Secondary | ICD-10-CM | POA: Diagnosis not present

## 2022-09-13 DIAGNOSIS — J441 Chronic obstructive pulmonary disease with (acute) exacerbation: Secondary | ICD-10-CM | POA: Diagnosis not present

## 2022-09-19 DIAGNOSIS — R69 Illness, unspecified: Secondary | ICD-10-CM | POA: Diagnosis not present

## 2022-09-19 DIAGNOSIS — J441 Chronic obstructive pulmonary disease with (acute) exacerbation: Secondary | ICD-10-CM | POA: Diagnosis not present

## 2022-09-19 DIAGNOSIS — U099 Post covid-19 condition, unspecified: Secondary | ICD-10-CM | POA: Diagnosis not present

## 2023-02-12 DIAGNOSIS — L309 Dermatitis, unspecified: Secondary | ICD-10-CM | POA: Diagnosis not present

## 2023-02-12 DIAGNOSIS — Z974 Presence of external hearing-aid: Secondary | ICD-10-CM | POA: Diagnosis not present

## 2023-02-12 DIAGNOSIS — H60503 Unspecified acute noninfective otitis externa, bilateral: Secondary | ICD-10-CM | POA: Diagnosis not present

## 2023-02-27 DIAGNOSIS — F339 Major depressive disorder, recurrent, unspecified: Secondary | ICD-10-CM | POA: Diagnosis not present

## 2023-02-27 DIAGNOSIS — F411 Generalized anxiety disorder: Secondary | ICD-10-CM | POA: Diagnosis not present

## 2023-03-13 DIAGNOSIS — F339 Major depressive disorder, recurrent, unspecified: Secondary | ICD-10-CM | POA: Diagnosis not present

## 2023-03-13 DIAGNOSIS — F411 Generalized anxiety disorder: Secondary | ICD-10-CM | POA: Diagnosis not present

## 2023-03-15 DIAGNOSIS — F339 Major depressive disorder, recurrent, unspecified: Secondary | ICD-10-CM | POA: Diagnosis not present

## 2023-03-15 DIAGNOSIS — F411 Generalized anxiety disorder: Secondary | ICD-10-CM | POA: Diagnosis not present

## 2023-03-20 DIAGNOSIS — F339 Major depressive disorder, recurrent, unspecified: Secondary | ICD-10-CM | POA: Diagnosis not present

## 2023-03-20 DIAGNOSIS — F411 Generalized anxiety disorder: Secondary | ICD-10-CM | POA: Diagnosis not present

## 2023-03-27 DIAGNOSIS — F339 Major depressive disorder, recurrent, unspecified: Secondary | ICD-10-CM | POA: Diagnosis not present

## 2023-03-27 DIAGNOSIS — F411 Generalized anxiety disorder: Secondary | ICD-10-CM | POA: Diagnosis not present

## 2023-03-29 DIAGNOSIS — F339 Major depressive disorder, recurrent, unspecified: Secondary | ICD-10-CM | POA: Diagnosis not present

## 2023-03-29 DIAGNOSIS — F411 Generalized anxiety disorder: Secondary | ICD-10-CM | POA: Diagnosis not present

## 2023-04-10 DIAGNOSIS — F411 Generalized anxiety disorder: Secondary | ICD-10-CM | POA: Diagnosis not present

## 2023-04-10 DIAGNOSIS — F339 Major depressive disorder, recurrent, unspecified: Secondary | ICD-10-CM | POA: Diagnosis not present

## 2023-04-17 DIAGNOSIS — F339 Major depressive disorder, recurrent, unspecified: Secondary | ICD-10-CM | POA: Diagnosis not present

## 2023-04-17 DIAGNOSIS — F411 Generalized anxiety disorder: Secondary | ICD-10-CM | POA: Diagnosis not present

## 2023-05-08 DIAGNOSIS — F339 Major depressive disorder, recurrent, unspecified: Secondary | ICD-10-CM | POA: Diagnosis not present

## 2023-05-08 DIAGNOSIS — F411 Generalized anxiety disorder: Secondary | ICD-10-CM | POA: Diagnosis not present

## 2023-05-15 DIAGNOSIS — F339 Major depressive disorder, recurrent, unspecified: Secondary | ICD-10-CM | POA: Diagnosis not present

## 2023-05-15 DIAGNOSIS — F411 Generalized anxiety disorder: Secondary | ICD-10-CM | POA: Diagnosis not present

## 2023-05-22 DIAGNOSIS — F339 Major depressive disorder, recurrent, unspecified: Secondary | ICD-10-CM | POA: Diagnosis not present

## 2023-05-22 DIAGNOSIS — F411 Generalized anxiety disorder: Secondary | ICD-10-CM | POA: Diagnosis not present

## 2023-05-29 DIAGNOSIS — F339 Major depressive disorder, recurrent, unspecified: Secondary | ICD-10-CM | POA: Diagnosis not present

## 2023-05-29 DIAGNOSIS — F3164 Bipolar disorder, current episode mixed, severe, with psychotic features: Secondary | ICD-10-CM | POA: Diagnosis not present

## 2023-05-29 DIAGNOSIS — E559 Vitamin D deficiency, unspecified: Secondary | ICD-10-CM | POA: Diagnosis not present

## 2023-05-29 DIAGNOSIS — F411 Generalized anxiety disorder: Secondary | ICD-10-CM | POA: Diagnosis not present

## 2023-05-29 DIAGNOSIS — E119 Type 2 diabetes mellitus without complications: Secondary | ICD-10-CM | POA: Diagnosis not present

## 2023-05-29 DIAGNOSIS — Z0001 Encounter for general adult medical examination with abnormal findings: Secondary | ICD-10-CM | POA: Diagnosis not present

## 2023-05-29 DIAGNOSIS — J449 Chronic obstructive pulmonary disease, unspecified: Secondary | ICD-10-CM | POA: Diagnosis not present

## 2023-05-29 DIAGNOSIS — R5383 Other fatigue: Secondary | ICD-10-CM | POA: Diagnosis not present

## 2023-05-29 DIAGNOSIS — Z6841 Body Mass Index (BMI) 40.0 and over, adult: Secondary | ICD-10-CM | POA: Diagnosis not present

## 2023-05-29 DIAGNOSIS — N3946 Mixed incontinence: Secondary | ICD-10-CM | POA: Diagnosis not present

## 2023-05-29 DIAGNOSIS — R7301 Impaired fasting glucose: Secondary | ICD-10-CM | POA: Diagnosis not present

## 2023-05-29 DIAGNOSIS — I10 Essential (primary) hypertension: Secondary | ICD-10-CM | POA: Diagnosis not present

## 2023-05-31 DIAGNOSIS — F339 Major depressive disorder, recurrent, unspecified: Secondary | ICD-10-CM | POA: Diagnosis not present

## 2023-05-31 DIAGNOSIS — F411 Generalized anxiety disorder: Secondary | ICD-10-CM | POA: Diagnosis not present

## 2023-06-05 DIAGNOSIS — F339 Major depressive disorder, recurrent, unspecified: Secondary | ICD-10-CM | POA: Diagnosis not present

## 2023-06-05 DIAGNOSIS — F411 Generalized anxiety disorder: Secondary | ICD-10-CM | POA: Diagnosis not present

## 2023-06-12 DIAGNOSIS — F411 Generalized anxiety disorder: Secondary | ICD-10-CM | POA: Diagnosis not present

## 2023-06-12 DIAGNOSIS — F339 Major depressive disorder, recurrent, unspecified: Secondary | ICD-10-CM | POA: Diagnosis not present

## 2023-06-26 DIAGNOSIS — F411 Generalized anxiety disorder: Secondary | ICD-10-CM | POA: Diagnosis not present

## 2023-06-26 DIAGNOSIS — F339 Major depressive disorder, recurrent, unspecified: Secondary | ICD-10-CM | POA: Diagnosis not present

## 2023-07-10 DIAGNOSIS — F339 Major depressive disorder, recurrent, unspecified: Secondary | ICD-10-CM | POA: Diagnosis not present

## 2023-07-10 DIAGNOSIS — F411 Generalized anxiety disorder: Secondary | ICD-10-CM | POA: Diagnosis not present

## 2023-07-17 DIAGNOSIS — F339 Major depressive disorder, recurrent, unspecified: Secondary | ICD-10-CM | POA: Diagnosis not present

## 2023-07-17 DIAGNOSIS — F411 Generalized anxiety disorder: Secondary | ICD-10-CM | POA: Diagnosis not present

## 2023-07-24 DIAGNOSIS — F339 Major depressive disorder, recurrent, unspecified: Secondary | ICD-10-CM | POA: Diagnosis not present

## 2023-07-24 DIAGNOSIS — F411 Generalized anxiety disorder: Secondary | ICD-10-CM | POA: Diagnosis not present

## 2023-07-31 DIAGNOSIS — F411 Generalized anxiety disorder: Secondary | ICD-10-CM | POA: Diagnosis not present

## 2023-07-31 DIAGNOSIS — F339 Major depressive disorder, recurrent, unspecified: Secondary | ICD-10-CM | POA: Diagnosis not present

## 2023-08-07 DIAGNOSIS — F339 Major depressive disorder, recurrent, unspecified: Secondary | ICD-10-CM | POA: Diagnosis not present

## 2023-08-07 DIAGNOSIS — F411 Generalized anxiety disorder: Secondary | ICD-10-CM | POA: Diagnosis not present

## 2023-08-24 DIAGNOSIS — F411 Generalized anxiety disorder: Secondary | ICD-10-CM | POA: Diagnosis not present

## 2023-08-24 DIAGNOSIS — F339 Major depressive disorder, recurrent, unspecified: Secondary | ICD-10-CM | POA: Diagnosis not present

## 2023-08-28 DIAGNOSIS — F411 Generalized anxiety disorder: Secondary | ICD-10-CM | POA: Diagnosis not present

## 2023-08-28 DIAGNOSIS — F339 Major depressive disorder, recurrent, unspecified: Secondary | ICD-10-CM | POA: Diagnosis not present

## 2023-09-04 DIAGNOSIS — F411 Generalized anxiety disorder: Secondary | ICD-10-CM | POA: Diagnosis not present

## 2023-09-04 DIAGNOSIS — F339 Major depressive disorder, recurrent, unspecified: Secondary | ICD-10-CM | POA: Diagnosis not present

## 2023-09-24 DIAGNOSIS — E119 Type 2 diabetes mellitus without complications: Secondary | ICD-10-CM | POA: Diagnosis not present

## 2023-09-24 DIAGNOSIS — F3164 Bipolar disorder, current episode mixed, severe, with psychotic features: Secondary | ICD-10-CM | POA: Diagnosis not present

## 2023-09-24 DIAGNOSIS — J449 Chronic obstructive pulmonary disease, unspecified: Secondary | ICD-10-CM | POA: Diagnosis not present

## 2023-09-24 DIAGNOSIS — Z6841 Body Mass Index (BMI) 40.0 and over, adult: Secondary | ICD-10-CM | POA: Diagnosis not present

## 2023-10-02 DIAGNOSIS — Z6841 Body Mass Index (BMI) 40.0 and over, adult: Secondary | ICD-10-CM | POA: Diagnosis not present

## 2023-10-02 DIAGNOSIS — Z0000 Encounter for general adult medical examination without abnormal findings: Secondary | ICD-10-CM | POA: Diagnosis not present

## 2023-10-02 DIAGNOSIS — J449 Chronic obstructive pulmonary disease, unspecified: Secondary | ICD-10-CM | POA: Diagnosis not present

## 2023-10-02 DIAGNOSIS — F3164 Bipolar disorder, current episode mixed, severe, with psychotic features: Secondary | ICD-10-CM | POA: Diagnosis not present

## 2023-10-02 DIAGNOSIS — G251 Drug-induced tremor: Secondary | ICD-10-CM | POA: Diagnosis not present

## 2023-10-02 DIAGNOSIS — L900 Lichen sclerosus et atrophicus: Secondary | ICD-10-CM | POA: Diagnosis not present

## 2023-10-02 DIAGNOSIS — H900 Conductive hearing loss, bilateral: Secondary | ICD-10-CM | POA: Diagnosis not present

## 2023-10-02 DIAGNOSIS — E559 Vitamin D deficiency, unspecified: Secondary | ICD-10-CM | POA: Diagnosis not present

## 2023-10-02 DIAGNOSIS — N3946 Mixed incontinence: Secondary | ICD-10-CM | POA: Diagnosis not present

## 2023-10-02 DIAGNOSIS — E119 Type 2 diabetes mellitus without complications: Secondary | ICD-10-CM | POA: Diagnosis not present

## 2023-10-02 DIAGNOSIS — I10 Essential (primary) hypertension: Secondary | ICD-10-CM | POA: Diagnosis not present

## 2024-07-03 ENCOUNTER — Other Ambulatory Visit: Payer: Self-pay | Admitting: Family Medicine

## 2024-07-03 ENCOUNTER — Ambulatory Visit
Admission: RE | Admit: 2024-07-03 | Discharge: 2024-07-03 | Disposition: A | Discharge status: Home / Self Care | Source: Ambulatory Visit | Attending: Family Medicine | Admitting: Family Medicine

## 2024-07-03 DIAGNOSIS — M1711 Unilateral primary osteoarthritis, right knee: Secondary | ICD-10-CM
